# Patient Record
Sex: Male | Born: 1935 | Race: White | Hispanic: No | Marital: Married | Smoking: Never smoker
Health system: Southern US, Community
[De-identification: ages and names within clinical notes are randomized; demographics above are authoritative.]

## PROBLEM LIST (undated history)

## (undated) DIAGNOSIS — E785 Hyperlipidemia, unspecified: Secondary | ICD-10-CM

## (undated) DIAGNOSIS — R972 Elevated prostate specific antigen [PSA]: Secondary | ICD-10-CM

## (undated) DIAGNOSIS — I10 Essential (primary) hypertension: Secondary | ICD-10-CM

## (undated) DIAGNOSIS — N400 Enlarged prostate without lower urinary tract symptoms: Secondary | ICD-10-CM

## (undated) DIAGNOSIS — M1990 Unspecified osteoarthritis, unspecified site: Secondary | ICD-10-CM

## (undated) HISTORY — PX: HAND SURGERY: SHX662

## (undated) HISTORY — DX: Essential (primary) hypertension: I10

## (undated) HISTORY — DX: Elevated prostate specific antigen (PSA): R97.20

## (undated) HISTORY — DX: Unspecified osteoarthritis, unspecified site: M19.90

## (undated) HISTORY — DX: Benign prostatic hyperplasia without lower urinary tract symptoms: N40.0

## (undated) HISTORY — DX: Hyperlipidemia, unspecified: E78.5

---

## 2013-05-25 ENCOUNTER — Other Ambulatory Visit: Payer: Self-pay | Admitting: Neurosurgery

## 2013-05-25 DIAGNOSIS — M5416 Radiculopathy, lumbar region: Secondary | ICD-10-CM

## 2013-05-25 DIAGNOSIS — M4714 Other spondylosis with myelopathy, thoracic region: Secondary | ICD-10-CM

## 2013-05-25 DIAGNOSIS — Z139 Encounter for screening, unspecified: Secondary | ICD-10-CM

## 2013-06-04 ENCOUNTER — Ambulatory Visit
Admission: RE | Admit: 2013-06-04 | Discharge: 2013-06-04 | Disposition: A | Discharge status: Home / Self Care | Payer: Medicare Other | Source: Ambulatory Visit | Attending: Neurosurgery | Admitting: Neurosurgery

## 2013-06-04 DIAGNOSIS — M4714 Other spondylosis with myelopathy, thoracic region: Secondary | ICD-10-CM

## 2013-06-04 DIAGNOSIS — M5416 Radiculopathy, lumbar region: Secondary | ICD-10-CM

## 2013-06-04 DIAGNOSIS — Z139 Encounter for screening, unspecified: Secondary | ICD-10-CM

## 2014-08-29 ENCOUNTER — Ambulatory Visit: Payer: Self-pay | Admitting: Endocrinology

## 2015-08-24 ENCOUNTER — Ambulatory Visit (INDEPENDENT_AMBULATORY_CARE_PROVIDER_SITE_OTHER): Payer: Medicare Other | Admitting: Urology

## 2015-08-24 ENCOUNTER — Encounter: Payer: Self-pay | Admitting: Urology

## 2015-08-24 VITALS — BP 175/73 | HR 51 | Ht 69.0 in | Wt 153.1 lb

## 2015-08-24 DIAGNOSIS — R972 Elevated prostate specific antigen [PSA]: Secondary | ICD-10-CM | POA: Diagnosis not present

## 2015-08-24 DIAGNOSIS — N401 Enlarged prostate with lower urinary tract symptoms: Secondary | ICD-10-CM | POA: Diagnosis not present

## 2015-08-24 DIAGNOSIS — N138 Other obstructive and reflux uropathy: Secondary | ICD-10-CM | POA: Insufficient documentation

## 2015-08-24 NOTE — Progress Notes (Signed)
08/24/2015 3:19 PM   Aaron Grimes July 21, 1930 [id number]  Referring provider: No referring provider defined for this encounter.  Chief Complaint  Patient presents with  . Benign Prostatic Hypertrophy    referred by Dr. Ronnald Collum  . Elevated PSA    HPI: Patient is an 81 year old white male who is referred to [place] by his primary care physician Dr. Ronnald Collum for an elevated PSA of 6.8 ng/mL.  Patient does have a family history of prostate cancer with his father having lethal prostate cancer and passing away at age 53.  He states he has no urinary symptoms at this time.  He does have a history of BPH with LUTS and was given a trial of tamsulosin, but he did not feel any significant improvement while on the medication.  He has since discontinued it.  He states his PSA was around 5 last year.    He has not had dysuria, gross hematuria or suprapubic pain. He has not had any bone pain, loss of appetite or weight loss.  Overall, he states he feels well.  BPH WITH LUTS His IPSS score today is 4, which is mild lower urinary tract symptomatology. He is pleased with his quality life due to his urinary symptoms.  He denies any dysuria, hematuria or suprapubic pain.   He also denies any recent fevers, chills, nausea or vomiting.  He has a family history of PCa, with his father having lethal prostate cancer.         IPSS      08/24/15 1400       International Prostate Symptom Score   How often have you had the sensation of not emptying your bladder? Not at All     How often have you had to urinate less than every two hours? Not at All     How often have you found you stopped and started again several times when you urinated? Less than 1 in 5 times     How often have you found it difficult to postpone urination? Not at All     How often have you had a weak urinary stream? About half the time     How often have you had to strain to start urination? Not at All     How many times did you  typically get up at night to urinate? None     Total IPSS Score 4     Quality of Life due to urinary symptoms   If you were to spend the rest of your life with your urinary condition just the way it is now how would you feel about that? Pleased        Score:  1-7 Mild 8-19 Moderate 20-35 Severe     PMH: Past Medical History  Diagnosis Date  . Arthritis   . BPH (benign prostatic hypertrophy)   . Elevated PSA   . HLD (hyperlipidemia)   . HTN (hypertension)     Surgical History: Past Surgical History  Procedure Laterality Date  . Hand surgery Right     nodule removed    Home Medications:    Medication List       This list is accurate as of: 08/24/15  3:19 PM.  Always use your most recent med list.               aspirin 81 MG tablet  Take 81 mg by mouth daily.     CENTRUM SILVER PO  Take by mouth daily.  HEART SAVIOR PO  Take by mouth daily.        Allergies: No Known Allergies  Family History: Family History  Problem Relation Age of Onset  . Prostate cancer Father     (not sure)  . Kidney disease Neg Hx   . Bone cancer Father     Social History:  reports that he has never smoked. He does not have any smokeless tobacco history on file. He reports that he does not drink alcohol or use illicit drugs.  ROS: UROLOGY Frequent Urination?: No Hard to postpone urination?: No Burning/pain with urination?: No Get up at night to urinate?: No Leakage of urine?: No Urine stream starts and stops?: No Trouble starting stream?: No Do you have to strain to urinate?: No Blood in urine?: No Urinary tract infection?: No Sexually transmitted disease?: No Injury to kidneys or bladder?: No Painful intercourse?: No Weak stream?: No Erection problems?: Yes Penile pain?: No  Gastrointestinal Nausea?: No Vomiting?: No Indigestion/heartburn?: No Diarrhea?: No Constipation?: No  Constitutional Fever: No Night sweats?: No Weight loss?: No Fatigue?:  No  Skin Skin rash/lesions?: No Itching?: Yes  Eyes Blurred vision?: No Double vision?: No  Ears/Nose/Throat Sore throat?: No Sinus problems?: No  Hematologic/Lymphatic Swollen glands?: No Easy bruising?: Yes  Cardiovascular Leg swelling?: Yes Chest pain?: No  Respiratory Cough?: No Shortness of breath?: No  Endocrine Excessive thirst?: No  Musculoskeletal Back pain?: No Joint pain?: No  Neurological Headaches?: No Dizziness?: No  Psychologic Depression?: No Anxiety?: No  Physical Exam: BP 175/73 mmHg  Pulse 51  Ht 5\' 9"  (1.753 m)  Wt 153 lb 1.6 oz (69.446 kg)  BMI 22.60 kg/m2  Constitutional: Well nourished. Alert and oriented, No acute distress. HEENT: [place] AT, moist mucus membranes. Trachea midline, no masses. Cardiovascular: No clubbing, cyanosis, or edema. Respiratory: Normal respiratory effort, no increased work of breathing. GI: Abdomen is soft, non tender, non distended, no abdominal masses. Liver and spleen not palpable.  No hernias appreciated.  Stool sample for occult testing is not indicated.   GU: No CVA tenderness.  No bladder fullness or masses.  Patient with circumcised phallus.   Urethral meatus is patent.  No penile discharge. No penile lesions or rashes. Scrotum without lesions, cysts, rashes and/or edema.  Testicles are located scrotally bilaterally.  They are atrophic.  (he had mumps as a child)  No masses are appreciated in the testicles. Left and right epididymis are normal. Rectal: Patient with  normal sphincter tone. Anus and perineum without scarring or rashes. No rectal masses are appreciated. Prostate is approximately 45 grams, a firm area is appreciated in the right lobe.  Seminal vesicles are normal. Skin: No rashes, bruises or suspicious lesions. Lymph: No cervical or inguinal adenopathy. Neurologic: Grossly intact, no focal deficits, moving all 4 extremities. Psychiatric: Normal mood and affect.  Laboratory Data: PSA  History  6.8 ng/mL on 08/22/2015   Assessment & Plan:    1. BPH (benign prostatic hyperplasia) with LUTS:   IPSS score 4/1.  Patient was given a trial with tamsulosin with Dr. Ronnald Collum, but he didn't feel that it made any difference.   He will be returning in 6 months for an IPSS score, exam and PSA.  - PSA  2. Elevated PSA:   Patient's PSA has risen to 6.8 ng/mL.  I have had it redrawn again today to rule out laboratory error.  I discussed with the patient the AUA guidelines for guarding men over 70 and PSA screening.  I explained that they recommended to have a discussion concerning PSA screening and then if a PSA screening is conducted the threshold value for the biopsy would be 10.0 ng/mL.  He would like to postpone a biopsy untill the PSA reaches this level.   I also explained to him that I did find an area of firmness within his prostate gland.   I discussed with the patient that without a biopsy,  I cannot be certain if this is a highly aggressive prostate cancer that may spread to his bones in a short period of time.   He understands this risk and would like to return in 6 months for a PSA and exam.     Return in about 6 months (around 02/22/2016) for exam and IPSS.  Zara Council, [facility] [address] [place], [place] [address] [phone]

## 2015-08-25 LAB — PSA: PROSTATE SPECIFIC AG, SERUM: 6.4 ng/mL — AB (ref 0.0–4.0)

## 2015-09-06 ENCOUNTER — Telehealth: Payer: Self-pay

## 2015-09-06 NOTE — Telephone Encounter (Signed)
Spoke with pt in reference to PSA results. Pt voiced understanding.  

## 2015-09-06 NOTE — Telephone Encounter (Signed)
-----   Message from Nori Riis, PA-C sent at 09/05/2015  5:00 PM EST ----- Patient's PSA is relatively unchanged from the value at his PCP's office.  He has chosen to wait 6 months for a repeat PSA and exam.  We will see him in June.

## 2015-12-04 ENCOUNTER — Encounter: Payer: Self-pay | Admitting: Podiatry

## 2015-12-04 ENCOUNTER — Ambulatory Visit (INDEPENDENT_AMBULATORY_CARE_PROVIDER_SITE_OTHER): Payer: Medicare Other | Admitting: Podiatry

## 2015-12-04 VITALS — BP 190/86 | HR 54 | Resp 16

## 2015-12-04 DIAGNOSIS — M79676 Pain in unspecified toe(s): Secondary | ICD-10-CM | POA: Diagnosis not present

## 2015-12-04 DIAGNOSIS — Q828 Other specified congenital malformations of skin: Secondary | ICD-10-CM | POA: Diagnosis not present

## 2015-12-04 DIAGNOSIS — B351 Tinea unguium: Secondary | ICD-10-CM | POA: Diagnosis not present

## 2015-12-04 NOTE — Progress Notes (Signed)
   Subjective:    Patient ID: Aaron Grimes, male    DOB: [birth year], 82 y.o.   MRN: [id number]  HPI: He presents today with his wife complaining of a painful corn to the lateral aspect fourth digit of the right foot. He states that he is a neurological problem from his back always down his right side. 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 He states that he noticed the corn between the fourth and fifth toes when his foot started to ache. He also complains of painfully elongated toenails bilateral. He also complains of a painful first metatarsal phalangeal joint right foot.    Review of Systems  HENT: Positive for hearing loss.   Eyes: Positive for visual disturbance.  Musculoskeletal: Positive for arthralgias.  Neurological: Positive for weakness and numbness.  All other systems reviewed and are negative.      Objective:   Physical Exam: I have reviewed his past medical history medications allergy surgery social history and review of systems. Pulses are strongly palpable. Neurologic sensorium is diminished her Semmes-Weinstein monofilament right foot. left foot appears to be normal. Muscle strength is normal bilateral. Orthopedic evaluation demonstrates osteoarthritic changes first metatarsal medial cuneiform joint as well as first metatarsal phalangeal joint. Limited range of motion in both these areas and hypertrophic bone growth is noted. Otherwise all joints distal to the ankle while full range of motion without crepitation. Mild flexible hammertoe deformities are noted. Cutaneous evaluation of a straight supple well-hydrated cutis no erythema edema cellulitis drainage or odor. Reactive hyperkeratotic lesion lateral aspect of the fourth toe right foot and medial aspect of the fifth toe right foot. No open lesions or wounds noted. His toenails are thick yellow dystrophic clinic for mycotic and  painful on palpation as well as debridement.        Assessment & Plan:  Assessment: Pain in limb secondary to onychomycosis and porokeratosis.  Plan: Debridement of all reactive hyperkeratotic tissue and toenails bilaterally. Follow up with him in 3 months.

## 2016-02-15 ENCOUNTER — Other Ambulatory Visit: Payer: Self-pay

## 2016-02-15 DIAGNOSIS — R972 Elevated prostate specific antigen [PSA]: Secondary | ICD-10-CM

## 2016-02-16 ENCOUNTER — Other Ambulatory Visit: Payer: Medicare Other

## 2016-02-16 DIAGNOSIS — R972 Elevated prostate specific antigen [PSA]: Secondary | ICD-10-CM

## 2016-02-17 LAB — PSA: PROSTATE SPECIFIC AG, SERUM: 8.2 ng/mL — AB (ref 0.0–4.0)

## 2016-02-23 ENCOUNTER — Encounter: Payer: Self-pay | Admitting: Urology

## 2016-02-23 ENCOUNTER — Ambulatory Visit (INDEPENDENT_AMBULATORY_CARE_PROVIDER_SITE_OTHER): Payer: Medicare Other | Admitting: Urology

## 2016-02-23 VITALS — BP 164/76 | HR 65 | Ht 69.0 in | Wt 156.9 lb

## 2016-02-23 DIAGNOSIS — N401 Enlarged prostate with lower urinary tract symptoms: Secondary | ICD-10-CM | POA: Diagnosis not present

## 2016-02-23 DIAGNOSIS — N138 Other obstructive and reflux uropathy: Secondary | ICD-10-CM

## 2016-02-23 DIAGNOSIS — R972 Elevated prostate specific antigen [PSA]: Secondary | ICD-10-CM

## 2016-02-23 MED ORDER — FINASTERIDE 5 MG PO TABS: 5.0000 mg | ORAL_TABLET | Freq: Every day | ORAL | Status: DC

## 2016-02-23 NOTE — Progress Notes (Signed)
8:47 AM   Aaron Grimes [birth year] [id number]  Referring provider: Lenard Simmer, MD [address] [place], [place] [address]  Chief Complaint  Patient presents with  . Benign Prostatic Hypertrophy    6 month follow up  . Elevated PSA    HPI: Patient is an 82 year old Caucasian male with a history of elevated PSA and BPH with LUTS who presents today for 6 month follow-up.  History of elevated PSA Patient was referred to [place] by his primary care physician Dr. Ronnald Collum for an elevated PSA of 6.8 ng/mL.  Patient does have a family history of prostate cancer with his father having lethal prostate cancer and passing away at age 4.  His most recent PSA was 8.2 ng/mL on 02/16/2016.    BPH WITH LUTS His IPSS score today is 6, which is mild lower urinary tract symptomatology. He is mostly satisfied with his quality life due to his urinary symptoms.  He denies any dysuria, hematuria or suprapubic pain.   He also denies any recent fevers, chills, nausea or vomiting.  He has a family history of PCa, with his father having lethal prostate cancer.        IPSS      02/23/16 0800       International Prostate Symptom Score   How often have you had the sensation of not emptying your bladder? Not at All     How often have you had to urinate less than every two hours? Not at All     How often have you found you stopped and started again several times when you urinated? Less than 1 in 5 times     How often have you found it difficult to postpone urination? Not at All     How often have you had a weak urinary stream? Almost always     How often have you had to strain to start urination? Not at All     How many times did you typically get up at night to urinate? None     Total IPSS Score 6     Quality of Life due to urinary symptoms   If you were to spend the rest of your life with your urinary condition just the way it is now how would you feel about that? Mostly Satisfied         Score:  1-7 Mild 8-19 Moderate 20-35 Severe     PMH: Past Medical History  Diagnosis Date  . Arthritis   . BPH (benign prostatic hypertrophy)   . Elevated PSA   . HLD (hyperlipidemia)   . HTN (hypertension)     Surgical History: Past Surgical History  Procedure Laterality Date  . Hand surgery Right     nodule removed    Home Medications:    Medication List       This list is accurate as of: 02/23/16  8:47 AM.  Always use your most recent med list.               aspirin 81 MG tablet  Take 81 mg by mouth daily.     CENTRUM SILVER PO  Take by mouth daily.     finasteride 5 MG tablet  Commonly known as:  PROSCAR  Take 1 tablet (5 mg total) by mouth daily.     FISH OIL PO  Take by mouth.     HEART SAVIOR PO  Take by mouth daily.  Allergies: No Known Allergies  Family History: Family History  Problem Relation Age of Onset  . Prostate cancer Father     (not sure)  . Kidney disease Neg Hx   . Bone cancer Father     Social History:  reports that he has never smoked. He does not have any smokeless tobacco history on file. He reports that he does not drink alcohol or use illicit drugs.  ROS: UROLOGY Frequent Urination?: No Hard to postpone urination?: No Burning/pain with urination?: No Get up at night to urinate?: No Leakage of urine?: No Urine stream starts and stops?: No Trouble starting stream?: No Do you have to strain to urinate?: No Blood in urine?: No Urinary tract infection?: No Sexually transmitted disease?: No Injury to kidneys or bladder?: No Painful intercourse?: No Weak stream?: Yes Erection problems?: Yes Penile pain?: No  Gastrointestinal Nausea?: No Vomiting?: No Indigestion/heartburn?: No Diarrhea?: No Constipation?: No  Constitutional Fever: No Night sweats?: No Weight loss?: No Fatigue?: No  Skin Skin rash/lesions?: No Itching?: Yes  Eyes Blurred vision?: No Double vision?:  No  Ears/Nose/Throat Sore throat?: No Sinus problems?: No  Hematologic/Lymphatic Swollen glands?: No Easy bruising?: Yes  Cardiovascular Leg swelling?: No Chest pain?: No  Respiratory Cough?: No Shortness of breath?: No  Endocrine Excessive thirst?: No  Musculoskeletal Back pain?: No Joint pain?: No  Neurological Headaches?: No Dizziness?: No  Psychologic Depression?: No Anxiety?: No  Physical Exam: BP 164/76 mmHg  Pulse 65  Ht 5\' 9"  (1.753 m)  Wt 156 lb 14.4 oz (71.169 kg)  BMI 23.16 kg/m2  Constitutional: Well nourished. Alert and oriented, No acute distress. HEENT: [place] AT, moist mucus membranes. Trachea midline, no masses. Cardiovascular: No clubbing, cyanosis, or edema. Respiratory: Normal respiratory effort, no increased work of breathing. GI: Abdomen is soft, non tender, non distended, no abdominal masses. Liver and spleen not palpable.  No hernias appreciated.  Stool sample for occult testing is not indicated.   GU: No CVA tenderness.  No bladder fullness or masses.  Patient with circumcised phallus.   Urethral meatus is patent.  No penile discharge. No penile lesions or rashes. Scrotum without lesions, cysts, rashes and/or edema.  Testicles are located scrotally bilaterally.  They are atrophic.  (he had mumps as a child)  No masses are appreciated in the testicles. Left and right epididymis are normal. Rectal: Patient with  normal sphincter tone. Anus and perineum without scarring or rashes. No rectal masses are appreciated. Prostate is approximately 45 grams, a firm area is appreciated in the right lobe.  Seminal vesicles are normal. Skin: No rashes, bruises or suspicious lesions. Lymph: No cervical or inguinal adenopathy. Neurologic: Grossly intact, no focal deficits, moving all 4 extremities. Psychiatric: Normal mood and affect.  Laboratory Data: PSA History  6.8 ng/mL on 08/22/2015  6.4 ng/mL on 08/24/2015  8.2 ng/mL on 02/16/2016   Assessment &  Plan:    1. BPH (benign prostatic hyperplasia) with LUTS:   IPSS score 6/2.   We will continue to monitor.  His PSA has risen slightly over the last 6 months.  He will start finasteride 5 mg daily and RTC in 3 months for a PSA.    2. Elevated PSA:   Patient's PSA has risen to 8.2 ng/mL.   I discussed with the patient the AUA guidelines for guarding men over 70 and PSA screening.   I explained that they recommended to have a discussion concerning PSA screening and then if a PSA screening is  conducted the threshold value for the biopsy would be 10.0 ng/mL.  He would like to postpone a biopsy until the PSA reaches this level.   I also explained to him that I did find an area of firmness within his prostate gland.   I discussed with the patient that without a biopsy,  I cannot be certain if this is a highly aggressive prostate cancer that may spread to his bones in a short period of time.   He will start finasteride at this time.  He understands this risk and would like to return in 3 months for a PSA.  If PSA continues to rise with the finasteride on board, we will need to have further discussions considering his next course of action.   Return in about 3 months (around 05/25/2016) for PSA only.  Zara Council, [facility] [address], [place], [place] [address] [phone]

## 2016-03-11 ENCOUNTER — Ambulatory Visit (INDEPENDENT_AMBULATORY_CARE_PROVIDER_SITE_OTHER): Payer: Medicare Other | Admitting: Podiatry

## 2016-03-11 ENCOUNTER — Encounter: Payer: Self-pay | Admitting: Podiatry

## 2016-03-11 DIAGNOSIS — M79676 Pain in unspecified toe(s): Secondary | ICD-10-CM

## 2016-03-11 DIAGNOSIS — Q828 Other specified congenital malformations of skin: Secondary | ICD-10-CM | POA: Diagnosis not present

## 2016-03-11 DIAGNOSIS — B351 Tinea unguium: Secondary | ICD-10-CM | POA: Diagnosis not present

## 2016-03-11 NOTE — Progress Notes (Signed)
He presents today with chief complaint of painful elongated toenails.  Objective: Vital signs are stable alert and oriented 3. Toenails are thick yellow dystrophic with mycotic painful palpation sharply incurvated nail margins.  Assessment: nails paronychia abscess hallux bilateral. Probable onychomycosis.  Plan: Debridement of toenails 1 through 5 bilateral.

## 2016-05-23 ENCOUNTER — Other Ambulatory Visit: Payer: Self-pay

## 2016-05-23 DIAGNOSIS — R972 Elevated prostate specific antigen [PSA]: Secondary | ICD-10-CM

## 2016-05-24 ENCOUNTER — Other Ambulatory Visit: Payer: Medicare Other

## 2016-05-24 DIAGNOSIS — R972 Elevated prostate specific antigen [PSA]: Secondary | ICD-10-CM

## 2016-05-25 LAB — PSA: PROSTATE SPECIFIC AG, SERUM: 4.6 ng/mL — AB (ref 0.0–4.0)

## 2016-05-28 ENCOUNTER — Encounter: Payer: Self-pay | Admitting: Urology

## 2016-05-28 ENCOUNTER — Telehealth: Payer: Self-pay

## 2016-05-28 NOTE — Telephone Encounter (Signed)
-----   Message from Nori Riis, PA-C sent at 05/28/2016  1:04 PM EDT ----- Please notify the patient that his PSA has reduced on the finasteride.  He should continue that medication and follow up in 6 months for an office visit.

## 2016-05-28 NOTE — Telephone Encounter (Signed)
Line busy

## 2016-05-29 ENCOUNTER — Other Ambulatory Visit: Payer: Self-pay

## 2016-05-29 DIAGNOSIS — N401 Enlarged prostate with lower urinary tract symptoms: Principal | ICD-10-CM

## 2016-05-29 DIAGNOSIS — N400 Enlarged prostate without lower urinary tract symptoms: Secondary | ICD-10-CM

## 2016-05-29 DIAGNOSIS — N138 Other obstructive and reflux uropathy: Secondary | ICD-10-CM

## 2016-05-29 MED ORDER — FINASTERIDE 5 MG PO TABS: 5.0000 mg | ORAL_TABLET | Freq: Every day | ORAL | 3 refills | Status: DC

## 2016-05-29 MED ORDER — DUTASTERIDE 0.5 MG PO CAPS: 0.5000 mg | ORAL_CAPSULE | Freq: Every day | ORAL | 3 refills | Status: DC

## 2016-05-29 NOTE — Telephone Encounter (Signed)
Made pt aware via mychart.  

## 2016-06-10 ENCOUNTER — Encounter: Payer: Self-pay | Admitting: Podiatry

## 2016-06-10 ENCOUNTER — Ambulatory Visit (INDEPENDENT_AMBULATORY_CARE_PROVIDER_SITE_OTHER): Payer: Medicare Other | Admitting: Podiatry

## 2016-06-10 DIAGNOSIS — M79676 Pain in unspecified toe(s): Secondary | ICD-10-CM | POA: Diagnosis not present

## 2016-06-10 DIAGNOSIS — Q828 Other specified congenital malformations of skin: Secondary | ICD-10-CM

## 2016-06-10 DIAGNOSIS — B351 Tinea unguium: Secondary | ICD-10-CM | POA: Diagnosis not present

## 2016-06-10 NOTE — Progress Notes (Signed)
He presents today with chief complaint of painful elongated toenails.  Objective: Toenails are thick yellow dystrophic with mycotic painful elongation.  Assessment: Pain limited secondary onychomycosis.  Plan: Debridement of toenails 1 through 5 bilateral.

## 2016-08-23 ENCOUNTER — Encounter: Payer: Self-pay | Admitting: Urology

## 2016-09-02 ENCOUNTER — Telehealth: Payer: Self-pay

## 2016-09-02 NOTE — Telephone Encounter (Signed)
Spoke with pt in reference to calling PCP about finasteride. Pt voiced understanding.

## 2016-09-02 NOTE — Telephone Encounter (Signed)
Patient should contact his PCP as dizziness is not a typical side effect of finasteride.  In fact, I would expect the dutasteride to make him dizzy as it is a common side effect of that medication.

## 2016-09-02 NOTE — Telephone Encounter (Signed)
Pt called stating he is currently taking finasteride. Pt c/o dizziness that lasted all day over the weekend. Pt stated that while he was on the dustasteride he did not experience any of these symptoms. Please advise.

## 2016-09-12 ENCOUNTER — Ambulatory Visit: Payer: Medicare Other | Admitting: Podiatry

## 2016-09-13 ENCOUNTER — Ambulatory Visit: Payer: Medicare Other | Admitting: Podiatry

## 2017-02-26 ENCOUNTER — Other Ambulatory Visit: Payer: Self-pay | Admitting: Urology

## 2017-02-26 DIAGNOSIS — N401 Enlarged prostate with lower urinary tract symptoms: Principal | ICD-10-CM

## 2017-02-26 DIAGNOSIS — N138 Other obstructive and reflux uropathy: Secondary | ICD-10-CM

## 2017-02-27 ENCOUNTER — Other Ambulatory Visit: Payer: Self-pay | Admitting: Urology

## 2017-02-27 DIAGNOSIS — N138 Other obstructive and reflux uropathy: Secondary | ICD-10-CM

## 2017-02-27 DIAGNOSIS — N401 Enlarged prostate with lower urinary tract symptoms: Principal | ICD-10-CM

## 2017-03-16 NOTE — Progress Notes (Signed)
9:30 AM   Aaron Grimes 1930-05-01 [id number]  Referring provider: Lenard Simmer, MD [address] [place], [place] [address]  Chief Complaint  Patient presents with  . Medication Refill    last seen 6/17 for BPH    HPI: Patient is an 83 year old Caucasian male with a history of elevated PSA and BPH with LUTS who presents today for 12 month follow-up.  History of elevated PSA Patient was referred to [place] by his primary care physician Dr. Ronnald Collum for an elevated PSA of 6.8 ng/mL.  Patient does have a family history of prostate cancer with his father having lethal prostate cancer and passing away at age 81.  His most recent PSA was 4.6 ng/mL on 09/01//2017.    BPH WITH LUTS His IPSS score today is 1, which is mild lower urinary tract symptomatology.  He is pleased with his quality life due to his urinary symptoms.  His previous I PSS score 6/2.  His major complaints today are a weak stream.  He is currently taking finasteride.  He denies any dysuria, hematuria or suprapubic pain.   He also denies any recent fevers, chills, nausea or vomiting.  He has a family history of PCa, with his father having lethal prostate cancer.        IPSS    Row Name 03/17/17 0900         International Prostate Symptom Score   How often have you had the sensation of not emptying your bladder? Not at All     How often have you had to urinate less than every two hours? Not at All     How often have you found you stopped and started again several times when you urinated? Not at All     How often have you found it difficult to postpone urination? Not at All     How often have you had a weak urinary stream? Less than 1 in 5 times     How often have you had to strain to start urination? Not at All     How many times did you typically get up at night to urinate? None     Total IPSS Score 1       Quality of Life due to urinary symptoms   If you were to spend the rest of your life with your  urinary condition just the way it is now how would you feel about that? Pleased        Score:  1-7 Mild 8-19 Moderate 20-35 Severe  Family history of prostate cancer He has a family history of PCa, with his father having lethal prostate cancer.      PMH: Past Medical History:  Diagnosis Date  . Arthritis   . BPH (benign prostatic hypertrophy)   . Elevated PSA   . HLD (hyperlipidemia)   . HTN (hypertension)     Surgical History: Past Surgical History:  Procedure Laterality Date  . HAND SURGERY Right    nodule removed    Home Medications:  Allergies as of 03/17/2017   No Known Allergies     Medication List       Accurate as of 03/17/17  9:30 AM. Always use your most recent med list.          aspirin 325 MG tablet Take 325 mg by mouth daily.   aspirin 81 MG tablet Take 81 mg by mouth daily.   CENTRUM SILVER PO Take by mouth daily.  clopidogrel 75 MG tablet Commonly known as:  PLAVIX Take 75 mg by mouth daily.   dutasteride 0.5 MG capsule Commonly known as:  AVODART Take 1 capsule (0.5 mg total) by mouth daily.   finasteride 5 MG tablet Commonly known as:  PROSCAR Take 1 tablet (5 mg total) by mouth daily.   FISH OIL PO Take by mouth.   HEART SAVIOR PO Take by mouth daily.   lisinopril 5 MG tablet Commonly known as:  PRINIVIL,ZESTRIL Take 5 mg by mouth daily.       Allergies: No Known Allergies  Family History: Family History  Problem Relation Age of Onset  . Prostate cancer Father        (not sure)  . Bone cancer Father   . Kidney disease Neg Hx   . Kidney cancer Neg Hx   . Bladder Cancer Neg Hx     Social History:  reports that he has never smoked. He has never used smokeless tobacco. He reports that he does not drink alcohol or use drugs.  ROS: UROLOGY Frequent Urination?: No Hard to postpone urination?: No Burning/pain with urination?: No Get up at night to urinate?: No Leakage of urine?: No Urine stream starts and  stops?: No Trouble starting stream?: No Do you have to strain to urinate?: No Blood in urine?: No Urinary tract infection?: No Sexually transmitted disease?: No Injury to kidneys or bladder?: No Painful intercourse?: No Weak stream?: No Erection problems?: No Penile pain?: No  Gastrointestinal Nausea?: No Vomiting?: No Indigestion/heartburn?: No Diarrhea?: No Constipation?: No  Constitutional Fever: No Night sweats?: No Weight loss?: No Fatigue?: No  Skin Skin rash/lesions?: No Itching?: No  Eyes Blurred vision?: No Double vision?: No  Ears/Nose/Throat Sore throat?: No Sinus problems?: No  Hematologic/Lymphatic Swollen glands?: No Easy bruising?: No  Cardiovascular Leg swelling?: No Chest pain?: No  Respiratory Cough?: No Shortness of breath?: No  Endocrine Excessive thirst?: No  Musculoskeletal Back pain?: No Joint pain?: No  Neurological Headaches?: No Dizziness?: No  Psychologic Depression?: No Anxiety?: No  Physical Exam: BP (!) 142/67   Pulse 64   Ht 5\' 9"  (1.753 m)   Wt 155 lb 3.2 oz (70.4 kg)   BMI 22.92 kg/m   Constitutional: Well nourished. Alert and oriented, No acute distress. HEENT: [place] AT, moist mucus membranes. Trachea midline, no masses. Cardiovascular: No clubbing, cyanosis, or edema. Respiratory: Normal respiratory effort, no increased work of breathing. GI: Abdomen is soft, non tender, non distended, no abdominal masses. Liver and spleen not palpable.  No hernias appreciated.  Stool sample for occult testing is not indicated.   GU: No CVA tenderness.  No bladder fullness or masses.  Patient with circumcised phallus.   Urethral meatus is patent.  No penile discharge. No penile lesions or rashes. Scrotum without lesions, cysts, rashes and/or edema.  Testicles are located scrotally bilaterally.  They are atrophic.  (he had mumps as a child)  No masses are appreciated in the testicles. Left and right epididymis are  normal. Rectal: Patient with  normal sphincter tone. Anus and perineum without scarring or rashes. No rectal masses are appreciated. Prostate is approximately 45 grams, a firm area is appreciated in the right lobe.  Seminal vesicles are normal. Skin: No rashes, bruises or suspicious lesions. Lymph: No cervical or inguinal adenopathy. Neurologic: Grossly intact, no focal deficits, moving all 4 extremities. Psychiatric: Normal mood and affect.  Laboratory Data: PSA History  6.8 ng/mL on 08/22/2015  6.4 ng/mL on 08/24/2015  8.2 ng/mL on 02/16/2016  4.6 ng/mL on 05/24/2016   Assessment & Plan:    1. BPH (benign prostatic hyperplasia) with LUTS:   IPSS score 6/2.   We will continue to monitor.  He will continue finasteride 5 mg daily and RTC in 12 months for I PSS and exam if PSA remains stable   2. Elevated PSA  - most recent PSA was 4.6 in 05/2016  - continue finasteride 5 mg daily; refills given  - PSA drawn today  3. Family history of prostate cancer  - father with lethal prostate cancer  - PSA drawn today  Return in about 1 year (around 03/17/2018) for IPSS, PSA and exam.  Zara Council, [facility]  [facility] [address], [place], [place] [address] [phone]

## 2017-03-17 ENCOUNTER — Encounter: Payer: Self-pay | Admitting: Urology

## 2017-03-17 ENCOUNTER — Ambulatory Visit (INDEPENDENT_AMBULATORY_CARE_PROVIDER_SITE_OTHER): Payer: Medicare Other | Admitting: Urology

## 2017-03-17 VITALS — BP 142/67 | HR 64 | Ht 69.0 in | Wt 155.2 lb

## 2017-03-17 DIAGNOSIS — N401 Enlarged prostate with lower urinary tract symptoms: Secondary | ICD-10-CM

## 2017-03-17 DIAGNOSIS — Z8042 Family history of malignant neoplasm of prostate: Secondary | ICD-10-CM | POA: Diagnosis not present

## 2017-03-17 DIAGNOSIS — N138 Other obstructive and reflux uropathy: Secondary | ICD-10-CM

## 2017-03-17 DIAGNOSIS — R972 Elevated prostate specific antigen [PSA]: Secondary | ICD-10-CM

## 2017-03-18 ENCOUNTER — Telehealth: Payer: Self-pay

## 2017-03-18 LAB — PSA: Prostate Specific Ag, Serum: 7.1 ng/mL — ABNORMAL HIGH (ref 0.0–4.0)

## 2017-03-18 NOTE — Telephone Encounter (Signed)
Spoke w/Tatiana at [facility] and test was added on will fax form to fill out for adding on test if specimen has not been discarded

## 2017-03-18 NOTE — Telephone Encounter (Signed)
-----   Message from Nori Riis, PA-C sent at 03/18/2017  7:51 AM EDT ----- Would you please add a free and total PSA to his blood work?

## 2017-03-19 ENCOUNTER — Other Ambulatory Visit: Payer: Self-pay | Admitting: Urology

## 2017-03-19 DIAGNOSIS — N401 Enlarged prostate with lower urinary tract symptoms: Principal | ICD-10-CM

## 2017-03-19 DIAGNOSIS — N138 Other obstructive and reflux uropathy: Secondary | ICD-10-CM

## 2017-03-25 ENCOUNTER — Encounter: Payer: Self-pay | Admitting: Urology

## 2017-03-28 ENCOUNTER — Telehealth: Payer: Self-pay | Admitting: Family Medicine

## 2017-03-28 NOTE — Telephone Encounter (Signed)
-----   Message from Nori Riis, PA-C sent at 03/26/2017 10:09 AM EDT ----- Please let Aaron Grimes know that his PSA is elevated, but it seems to trend up and down.  His corrected value for his PSA at this time is 14.4 and his probability of having prostate cancer at this time is 35%.   Due to his age, I would recommend to continue to monitor the PSA through [place] and his PCP.  We will see him in 6 months.

## 2017-03-28 NOTE — Telephone Encounter (Signed)
Left message for patient to return call.

## 2017-03-28 NOTE — Telephone Encounter (Signed)
Patient returned call.  I provided him with the message below regarding his PSA.  Patient expressed understanding.  6 month lab appointment scheduled for 09/29/17 at 8:30am and office visit with [facility] scheduled for 10/06/17 at 8:30am

## 2017-04-10 LAB — PSA, TOTAL AND FREE
PROSTATE SPECIFIC AG, SERUM: 7.2 ng/mL — AB (ref 0.0–4.0)
PSA FREE PCT: 13.9 %
PSA FREE: 1 ng/mL

## 2017-04-10 LAB — SPECIMEN STATUS REPORT

## 2017-09-10 ENCOUNTER — Other Ambulatory Visit: Payer: Self-pay | Admitting: Urology

## 2017-09-10 DIAGNOSIS — N401 Enlarged prostate with lower urinary tract symptoms: Principal | ICD-10-CM

## 2017-09-10 DIAGNOSIS — N138 Other obstructive and reflux uropathy: Secondary | ICD-10-CM

## 2017-09-29 ENCOUNTER — Other Ambulatory Visit: Payer: Self-pay

## 2017-09-29 ENCOUNTER — Other Ambulatory Visit: Payer: Medicare Other

## 2017-09-29 DIAGNOSIS — N401 Enlarged prostate with lower urinary tract symptoms: Secondary | ICD-10-CM

## 2017-09-30 LAB — PSA: Prostate Specific Ag, Serum: 6.4 ng/mL — ABNORMAL HIGH (ref 0.0–4.0)

## 2017-10-05 NOTE — Progress Notes (Signed)
8:55 AM   Aaron Grimes [birth year] [id number]  Referring provider: Lenard Simmer, MD [address] [place], [place] [address]  Chief Complaint  Patient presents with  . Benign Prostatic Hypertrophy  . Follow-up    HPI: Patient is an 84 year old Caucasian male with an elevated PSA and BPH with LUTS who presents today for 12 month follow-up.  Elevated PSA Patient was referred to [place] by his primary care physician Dr. Ronnald Collum for an elevated PSA of 6.8 ng/mL.  Patient does have a family history of prostate cancer with his father having lethal prostate cancer and passing away at age 81.  His most recent PSA was 6.4 ng/mL on 09/29/2017.  He states his PCP, Dr. Ronnald Collum obtained a PSA ~ 3 months and it was around 6.    Trend below:    Component     Latest Ref Rng & Units 08/24/2015 02/16/2016 05/24/2016 03/17/2017            9:11 AM  Prostate Specific Ag, Serum     0.0 - 4.0 ng/mL 6.4 (H) 8.2 (H) 4.6 (H) 7.1 (H)   Component     Latest Ref Rng & Units 03/17/2017 09/29/2017         9:11 AM   Prostate Specific Ag, Serum     0.0 - 4.0 ng/mL 7.2 (H) 6.4 (H)      BPH WITH LUTS His IPSS score today is 6, which is mild lower urinary tract symptomatology.  He is pleased with his quality life due to his urinary symptoms.  His previous I PSS score 1/1.  His major complaint(s) today is/are intermittency and a weak urinary stream.  He is currently taking finasteride 5 mg daily.  He denies any dysuria, hematuria or suprapubic pain.   He also denies any recent fevers, chills, nausea or vomiting.  He has a family history of PCa, with his father having lethal prostate cancer.    IPSS    Row Name 10/06/17 0800         International Prostate Symptom Score   How often have you had the sensation of not emptying your bladder?  Not at All     How often have you had to urinate less than every two hours?  Not at All     How often have you found you stopped and started again several times  when you urinated?  About half the time     How often have you found it difficult to postpone urination?  Not at All     How often have you had a weak urinary stream?  About half the time     How often have you had to strain to start urination?  Not at All     How many times did you typically get up at night to urinate?  None     Total IPSS Score  6       Quality of Life due to urinary symptoms   If you were to spend the rest of your life with your urinary condition just the way it is now how would you feel about that?  Pleased        Score:  1-7 Mild 8-19 Moderate 20-35 Severe  Family history of prostate cancer He has a family history of PCa, with his father having lethal prostate cancer.      PMH: Past Medical History:  Diagnosis Date  . Arthritis   . BPH (benign prostatic hypertrophy)   .  Elevated PSA   . HLD (hyperlipidemia)   . HTN (hypertension)     Surgical History: Past Surgical History:  Procedure Laterality Date  . HAND SURGERY Right    nodule removed    Home Medications:  Allergies as of 10/06/2017   No Known Allergies     Medication List        Accurate as of 10/06/17  8:55 AM. Always use your most recent med list.          aspirin 325 MG tablet Take 325 mg by mouth daily.   clopidogrel 75 MG tablet Commonly known as:  PLAVIX Take 75 mg by mouth daily.   finasteride 5 MG tablet Commonly known as:  PROSCAR Take 1 tablet (5 mg total) by mouth daily.   FISH OIL PO Take by mouth.   lisinopril 5 MG tablet Commonly known as:  PRINIVIL,ZESTRIL Take 5 mg by mouth daily.   Vitamin D (Ergocalciferol) 50000 units Caps capsule Commonly known as:  DRISDOL Take 50,000 Units by mouth every 7 (seven) days.       Allergies: No Known Allergies  Family History: Family History  Problem Relation Age of Onset  . Prostate cancer Father        (not sure)  . Bone cancer Father   . Kidney disease Neg Hx   . Kidney cancer Neg Hx   . Bladder Cancer  Neg Hx     Social History:  reports that  has never smoked. he has never used smokeless tobacco. He reports that he does not drink alcohol or use drugs.  ROS: UROLOGY Frequent Urination?: No Hard to postpone urination?: No Burning/pain with urination?: No Get up at night to urinate?: No Leakage of urine?: No Urine stream starts and stops?: No Trouble starting stream?: No Do you have to strain to urinate?: No Blood in urine?: No Urinary tract infection?: No Sexually transmitted disease?: No Injury to kidneys or bladder?: No Painful intercourse?: No Weak stream?: No Erection problems?: Yes Penile pain?: No  Gastrointestinal Nausea?: No Vomiting?: No Indigestion/heartburn?: No Diarrhea?: No Constipation?: No  Constitutional Fever: No Night sweats?: No Weight loss?: No Fatigue?: No  Skin Skin rash/lesions?: No Itching?: No  Eyes Blurred vision?: No Double vision?: No  Ears/Nose/Throat Sore throat?: No Sinus problems?: No  Hematologic/Lymphatic Swollen glands?: No Easy bruising?: No  Cardiovascular Leg swelling?: No Chest pain?: No  Respiratory Cough?: Yes Shortness of breath?: Yes  Endocrine Excessive thirst?: No  Musculoskeletal Back pain?: No Joint pain?: Yes  Neurological Headaches?: No Dizziness?: No  Psychologic Depression?: No Anxiety?: No  Physical Exam: BP 135/71   Pulse 64   Ht 5\' 8"  (1.727 m)   Wt 152 lb (68.9 kg)   BMI 23.11 kg/m   Constitutional: Well nourished. Alert and oriented, No acute distress. HEENT: [place] AT, moist mucus membranes. Trachea midline, no masses. Cardiovascular: No clubbing, cyanosis, or edema. Respiratory: Normal respiratory effort, no increased work of breathing. GI: Abdomen is soft, non tender, non distended, no abdominal masses. Liver and spleen not palpable.  No hernias appreciated.  Stool sample for occult testing is not indicated.   GU: No CVA tenderness.  No bladder fullness or masses.  Patient  with circumcised phallus.  Urethral meatus is patent.  No penile discharge. No penile lesions or rashes. Scrotum without lesions, cysts, rashes and/or edema.  Testicles are located scrotally bilaterally. No masses are appreciated in the testicles. Left and right epididymis are normal. Rectal: Patient with  normal sphincter  tone. Anus and perineum without scarring or rashes. No rectal masses are appreciated. Prostate is approximately 45 grams, a firm area is still appreciated in the right lobe.  Seminal vesicles are normal. Skin: No rashes, bruises or suspicious lesions. Lymph: No cervical or inguinal adenopathy. Neurologic: Grossly intact, no focal deficits, moving all 4 extremities. Psychiatric: Normal mood and affect.   Laboratory Data: PSA History  6.8 ng/mL on 08/22/2015  6.4 ng/mL on 08/24/2015  8.2 ng/mL on 02/16/2016  Started on finasteride in 02/2016  4.6 ng/mL on 05/24/2016  Patient discontinued the finasteride   7.2 ng/mL in 02/2017  Patient restarted the finasteride in 02/2017  6.4 ng/mL in 09/2017  I have reviewed the labs.  Assessment & Plan:    1. BPH (benign prostatic hyperplasia) with LUTS:   IPSS score 6/1, it is mildly improved.  We will continue to monitor.  He will continue finasteride 5 mg daily and RTC in 6 months for I PSS and exam if PSA remains stable   2. Elevated PSA  - most recent PSA was 6.4 in 09/2017  - discussed with patient that this elevated PSA may be the result of a prostate cancer, but without a biopsy we could not be sure - offered to continue to monitor the PSA yearly, increase monitoring frequency to q 6 or q 3 months and if PSA rises significantly pursue imaging studies to evaluate for metastasis or schedule a biopsy at this time - patient would like to continue to monitor his PSA q 6 months alternating between his PCP in January and [place] in July - he will return in July for PSA and exam  3. Abnormal prostate exam  - see above   4. Family  history of prostate cancer  - father with lethal prostate cancer    Return in about 6 months (around 04/05/2018) for IPSS, PSA and exam.  Zara Council, [facility]  [facility] [address] [place], [place] [address] [phone]

## 2017-10-06 ENCOUNTER — Encounter: Payer: Self-pay | Admitting: Urology

## 2017-10-06 ENCOUNTER — Ambulatory Visit: Payer: Medicare Other | Admitting: Urology

## 2017-10-06 VITALS — BP 135/71 | HR 64 | Ht 68.0 in | Wt 152.0 lb

## 2017-10-06 DIAGNOSIS — R3989 Other symptoms and signs involving the genitourinary system: Secondary | ICD-10-CM

## 2017-10-06 DIAGNOSIS — N401 Enlarged prostate with lower urinary tract symptoms: Secondary | ICD-10-CM

## 2017-10-06 DIAGNOSIS — N138 Other obstructive and reflux uropathy: Secondary | ICD-10-CM | POA: Diagnosis not present

## 2017-10-06 DIAGNOSIS — Z8042 Family history of malignant neoplasm of prostate: Secondary | ICD-10-CM | POA: Diagnosis not present

## 2017-10-06 DIAGNOSIS — R972 Elevated prostate specific antigen [PSA]: Secondary | ICD-10-CM | POA: Diagnosis not present

## 2017-10-06 MED ORDER — FINASTERIDE 5 MG PO TABS: 5.0000 mg | ORAL_TABLET | Freq: Every day | ORAL | 3 refills | Status: DC

## 2018-03-09 ENCOUNTER — Other Ambulatory Visit: Payer: Self-pay | Admitting: Family Medicine

## 2018-03-09 DIAGNOSIS — R972 Elevated prostate specific antigen [PSA]: Secondary | ICD-10-CM

## 2018-03-12 NOTE — Progress Notes (Signed)
9:39 AM   Aaron Grimes February 26, 1930 [id number]  Referring provider: Lenard Simmer, MD [address] [place], [place] [address]  Chief Complaint  Patient presents with  . Elevated PSA  . Benign Prostatic Hypertrophy    HPI: Patient is an 84 year old Caucasian male with an elevated PSA and BPH with LUTS who presents today for 12 month follow-up.  Elevated PSA PSA Trend  6.8 in 07/2015  6.4 in 08/2015  8.2 in 01/2016 - started finasteride  4.6 (11.2) in 05/2016  7.1 (14.2) in 02/2017 - 35% probability - patient stopped finasteride/restarted  6.4  (12.8) in 09/2017  7.4 (14.8) in 02/2018   BPH WITH LUTS His IPSS score today is 4, which is mild lower urinary tract symptomatology.  He is pleased with his quality life due to his urinary symptoms.  His previous I PSS score 6/1.  He has no urinary complaints at this time.  He is currently taking finasteride 5 mg daily.  He denies any dysuria, hematuria or suprapubic pain.   He also denies any recent fevers, chills, nausea or vomiting.  He has a family history of PCa, with his father having lethal prostate cancer.    IPSS    Row Name 03/16/18 0900         International Prostate Symptom Score   How often have you had the sensation of not emptying your bladder?  Not at All     How often have you had to urinate less than every two hours?  Not at All     How often have you found you stopped and started again several times when you urinated?  Less than 1 in 5 times     How often have you found it difficult to postpone urination?  Not at All     How often have you had a weak urinary stream?  About half the time     How often have you had to strain to start urination?  Not at All     How many times did you typically get up at night to urinate?  None     Total IPSS Score  4       Quality of Life due to urinary symptoms   If you were to spend the rest of your life with your urinary condition just the way it is now how would  you feel about that?  Pleased        Score:  1-7 Mild 8-19 Moderate 20-35 Severe  Family history of prostate cancer He has a family history of PCa, with his father having lethal prostate cancer.     PMH: Past Medical History:  Diagnosis Date  . Arthritis   . BPH (benign prostatic hypertrophy)   . Elevated PSA   . HLD (hyperlipidemia)   . HTN (hypertension)     Surgical History: Past Surgical History:  Procedure Laterality Date  . HAND SURGERY Right    nodule removed    Home Medications:  Allergies as of 03/16/2018   No Known Allergies     Medication List        Accurate as of 03/16/18  9:39 AM. Always use your most recent med list.          aspirin 325 MG tablet Take 325 mg by mouth daily.   clopidogrel 75 MG tablet Commonly known as:  PLAVIX Take 75 mg by mouth daily.   finasteride 5 MG tablet Commonly known as:  PROSCAR  Take 1 tablet (5 mg total) by mouth daily.   finasteride 5 MG tablet Commonly known as:  PROSCAR Take 1 tablet (5 mg total) by mouth daily.   FISH OIL PO Take by mouth.   lisinopril 5 MG tablet Commonly known as:  PRINIVIL,ZESTRIL Take 5 mg by mouth daily.   Vitamin D (Ergocalciferol) 50000 units Caps capsule Commonly known as:  DRISDOL Take 50,000 Units by mouth every 7 (seven) days.       Allergies: No Known Allergies  Family History: Family History  Problem Relation Age of Onset  . Prostate cancer Father        (not sure)  . Bone cancer Father   . Kidney disease Neg Hx   . Kidney cancer Neg Hx   . Bladder Cancer Neg Hx     Social History:  reports that he has never smoked. He has never used smokeless tobacco. He reports that he does not drink alcohol or use drugs.  ROS: UROLOGY Frequent Urination?: No Hard to postpone urination?: No Burning/pain with urination?: No Get up at night to urinate?: No Leakage of urine?: No Urine stream starts and stops?: No Trouble starting stream?: No Do you have to strain  to urinate?: No Blood in urine?: No Urinary tract infection?: No Sexually transmitted disease?: No Injury to kidneys or bladder?: No Painful intercourse?: No Weak stream?: Yes Erection problems?: Yes Penile pain?: No  Gastrointestinal Nausea?: No Vomiting?: No Indigestion/heartburn?: No Diarrhea?: No Constipation?: No  Constitutional Fever: No Night sweats?: No Weight loss?: No Fatigue?: No  Skin Skin rash/lesions?: No Itching?: No  Eyes Blurred vision?: No Double vision?: No  Ears/Nose/Throat Sore throat?: No Sinus problems?: No  Hematologic/Lymphatic Swollen glands?: No Easy bruising?: No  Cardiovascular Leg swelling?: No Chest pain?: No  Respiratory Cough?: No Shortness of breath?: No  Endocrine Excessive thirst?: No  Musculoskeletal Back pain?: No Joint pain?: No  Neurological Headaches?: No Dizziness?: No  Psychologic Depression?: No Anxiety?: No  Physical Exam: BP (!) 155/70 (BP Location: Right Arm, Patient Position: Sitting, Cuff Size: Normal)   Pulse 62   Ht 5\' 8"  (1.727 m)   Wt 155 lb 4.8 oz (70.4 kg)   SpO2 99%   BMI 23.61 kg/m   Constitutional: Well nourished. Alert and oriented, No acute distress. HEENT: [place] AT, moist mucus membranes. Trachea midline, no masses. Cardiovascular: No clubbing, cyanosis, or edema. Respiratory: Normal respiratory effort, no increased work of breathing. GI: Abdomen is soft, non tender, non distended, no abdominal masses. Liver and spleen not palpable.  No hernias appreciated.  Stool sample for occult testing is not indicated.   GU: No CVA tenderness.  No bladder fullness or masses.  Patient with circumcised phallus.   Urethral meatus is patent.  No penile discharge. No penile lesions or rashes. Scrotum without lesions, cysts, rashes and/or edema.  Testicles are located scrotally bilaterally. No masses are appreciated in the testicles. Left and right epididymis are normal. Rectal: Patient with  normal  sphincter tone. Anus and perineum without scarring or rashes. No rectal masses are appreciated. Prostate is approximately 45 grams, very firm area in the right lobe.  Seminal vesicles are normal. Skin: No rashes, bruises or suspicious lesions. Lymph: No cervical or inguinal adenopathy. Neurologic: Grossly intact, no focal deficits, moving all 4 extremities. Psychiatric: Normal mood and affect.    Laboratory Data: See HPI I have reviewed the labs.  Assessment & Plan:    1. BPH (benign prostatic hyperplasia) with LUTS:  IPSS score 4/1, it is mildly improved.  We will continue to monitor.  He will continue finasteride 5 mg daily and RTC in 3 months for I PSS, exam and PSA   2. Elevated PSA Most recent PSA was 7.4 (14.8) in 02/2018 I expressed my concerns again that the increasing PSA level is due to prostate cancer as he is on finasteride and in normal prostate tissue, finasteride should repress growth and in turn decrease the PSA level.  I recommended a prostate biopsy at this time, but the patient refused.   I discussed the risks of an undiagnosed and untreated prostate cancer.  He stated he understood these risks, but he stated his age of 21 it was not his desire to prolong his life.  He is feeling well and would like to pursue diagnosis and treatment only if he should become symptomatic.  He did agree to return in 3 months for PSA, exam and symptom recheck.  3. Abnormal prostate exam  - see above   4. Family history of prostate cancer  - father with lethal prostate cancer    Return in about 3 months (around 06/16/2018) for IPSS, PSA and exam.  Zara Council, [facility]  [place] [address] [place], [place] [address] [phone]

## 2018-03-13 ENCOUNTER — Other Ambulatory Visit: Payer: Medicare Other

## 2018-03-13 DIAGNOSIS — R972 Elevated prostate specific antigen [PSA]: Secondary | ICD-10-CM

## 2018-03-14 LAB — PSA: Prostate Specific Ag, Serum: 7.4 ng/mL — ABNORMAL HIGH (ref 0.0–4.0)

## 2018-03-16 ENCOUNTER — Ambulatory Visit: Payer: Medicare Other | Admitting: Urology

## 2018-03-16 ENCOUNTER — Encounter: Payer: Self-pay | Admitting: Urology

## 2018-03-16 VITALS — BP 155/70 | HR 62 | Ht 68.0 in | Wt 155.3 lb

## 2018-03-16 DIAGNOSIS — Z8042 Family history of malignant neoplasm of prostate: Secondary | ICD-10-CM | POA: Diagnosis not present

## 2018-03-16 DIAGNOSIS — R972 Elevated prostate specific antigen [PSA]: Secondary | ICD-10-CM | POA: Diagnosis not present

## 2018-03-16 DIAGNOSIS — R3989 Other symptoms and signs involving the genitourinary system: Secondary | ICD-10-CM | POA: Diagnosis not present

## 2018-03-16 DIAGNOSIS — N401 Enlarged prostate with lower urinary tract symptoms: Secondary | ICD-10-CM

## 2018-03-16 DIAGNOSIS — N138 Other obstructive and reflux uropathy: Secondary | ICD-10-CM | POA: Diagnosis not present

## 2018-03-16 MED ORDER — FINASTERIDE 5 MG PO TABS
5.0000 mg | ORAL_TABLET | Freq: Every day | ORAL | 3 refills | Status: DC
Start: 2018-03-16 — End: 2019-05-20

## 2018-06-12 ENCOUNTER — Other Ambulatory Visit: Payer: Medicare Other

## 2018-06-12 DIAGNOSIS — R972 Elevated prostate specific antigen [PSA]: Secondary | ICD-10-CM

## 2018-06-13 LAB — PSA: Prostate Specific Ag, Serum: 8.1 ng/mL — ABNORMAL HIGH (ref 0.0–4.0)

## 2018-06-15 ENCOUNTER — Encounter: Payer: Self-pay | Admitting: Urology

## 2018-06-15 ENCOUNTER — Ambulatory Visit: Payer: Medicare Other | Admitting: Urology

## 2018-06-15 VITALS — BP 150/75 | HR 69 | Ht 68.0 in | Wt 155.9 lb

## 2018-06-15 DIAGNOSIS — R3989 Other symptoms and signs involving the genitourinary system: Secondary | ICD-10-CM

## 2018-06-15 DIAGNOSIS — R972 Elevated prostate specific antigen [PSA]: Secondary | ICD-10-CM

## 2018-06-15 DIAGNOSIS — N138 Other obstructive and reflux uropathy: Secondary | ICD-10-CM

## 2018-06-15 DIAGNOSIS — Z8042 Family history of malignant neoplasm of prostate: Secondary | ICD-10-CM | POA: Diagnosis not present

## 2018-06-15 DIAGNOSIS — N401 Enlarged prostate with lower urinary tract symptoms: Secondary | ICD-10-CM | POA: Diagnosis not present

## 2018-06-15 LAB — BLADDER SCAN AMB NON-IMAGING: SCAN RESULT: 0

## 2018-06-15 NOTE — Progress Notes (Signed)
9:04 AM   Aaron Grimes 10-07-1929 [id number]  Referring provider: Lenard Simmer, MD [address] [place], [place] [address]  Chief Complaint  Patient presents with  . Follow-up    HPI: Patient is an 84 year old Caucasian male with an elevated PSA and BPH with LUTS who presents today for 3 month follow-up.  Elevated PSA PSA Trend  6.8 in 07/2015  6.4 in 08/2015  8.2 in 01/2016 - started finasteride  4.6 (11.2) in 05/2016  7.1 (14.2) in 02/2017 - 35% probability - patient stopped finasteride/restarted  6.4  (12.8) in 09/2017  7.4 (14.8) in 02/2018   8.1 (16.2) in 05/2018  BPH WITH LUTS His IPSS score today is 2, which is mild lower urinary tract symptomatology.  He is delighted with his quality life due to his urinary symptoms.  His previous I PSS score 4/1.  He has no urinary complaints at this time.  He is currently taking finasteride 5 mg daily.  He denies any dysuria, hematuria or suprapubic pain.   He also denies any recent fevers, chills, nausea or vomiting.  He has a family history of PCa, with his father having lethal prostate cancer.    IPSS    Row Name 06/15/18 0800         International Prostate Symptom Score   How often have you had the sensation of not emptying your bladder?  Not at All     How often have you had to urinate less than every two hours?  Not at All     How often have you found you stopped and started again several times when you urinated?  Not at All     How often have you found it difficult to postpone urination?  Not at All     How often have you had a weak urinary stream?  Less than half the time     How often have you had to strain to start urination?  Not at All     How many times did you typically get up at night to urinate?  None     Total IPSS Score  2       Quality of Life due to urinary symptoms   If you were to spend the rest of your life with your urinary condition just the way it is now how would you feel about  that?  Delighted        Score:  1-7 Mild 8-19 Moderate 20-35 Severe  Family history of prostate cancer He has a family history of PCa, with his father having lethal prostate cancer.     PMH: Past Medical History:  Diagnosis Date  . Arthritis   . BPH (benign prostatic hypertrophy)   . Elevated PSA   . HLD (hyperlipidemia)   . HTN (hypertension)     Surgical History: Past Surgical History:  Procedure Laterality Date  . HAND SURGERY Right    nodule removed    Home Medications:  Allergies as of 06/15/2018   No Known Allergies     Medication List        Accurate as of 06/15/18  9:04 AM. Always use your most recent med list.          aspirin 325 MG tablet Take 325 mg by mouth daily.   clopidogrel 75 MG tablet Commonly known as:  PLAVIX Take 75 mg by mouth daily.   finasteride 5 MG tablet Commonly known as:  PROSCAR Take 1 tablet (  5 mg total) by mouth daily.   FISH OIL PO Take by mouth.   lisinopril 5 MG tablet Commonly known as:  PRINIVIL,ZESTRIL Take 5 mg by mouth daily.   rosuvastatin 20 MG tablet Commonly known as:  CRESTOR Take 20 mg by mouth daily.   Vitamin D (Ergocalciferol) 50000 units Caps capsule Commonly known as:  DRISDOL Take 50,000 Units by mouth every 7 (seven) days.       Allergies: No Known Allergies  Family History: Family History  Problem Relation Age of Onset  . Prostate cancer Father        (not sure)  . Bone cancer Father   . Kidney disease Neg Hx   . Kidney cancer Neg Hx   . Bladder Cancer Neg Hx     Social History:  reports that he has never smoked. He has never used smokeless tobacco. He reports that he does not drink alcohol or use drugs.  ROS: UROLOGY Frequent Urination?: No Hard to postpone urination?: No Burning/pain with urination?: No Get up at night to urinate?: No Leakage of urine?: No Urine stream starts and stops?: No Trouble starting stream?: No Do you have to strain to urinate?: No Blood in  urine?: No Urinary tract infection?: No Sexually transmitted disease?: No Injury to kidneys or bladder?: No Painful intercourse?: No Weak stream?: No Erection problems?: No Penile pain?: No  Gastrointestinal Nausea?: No Vomiting?: No Indigestion/heartburn?: No Diarrhea?: No Constipation?: No  Constitutional Fever: No Night sweats?: No Weight loss?: No Fatigue?: No  Skin Skin rash/lesions?: No Itching?: No  Eyes Blurred vision?: No Double vision?: No  Ears/Nose/Throat Sore throat?: No Sinus problems?: No  Hematologic/Lymphatic Swollen glands?: No Easy bruising?: No  Cardiovascular Leg swelling?: No Chest pain?: No  Respiratory Cough?: No Shortness of breath?: No  Endocrine Excessive thirst?: No  Musculoskeletal Back pain?: No Joint pain?: No  Neurological Headaches?: No Dizziness?: No  Psychologic Depression?: No Anxiety?: No  Physical Exam: BP (!) 150/75 (BP Location: Left Arm, Patient Position: Sitting, Cuff Size: Normal)   Pulse 69   Ht 5\' 8"  (1.727 m)   Wt 155 lb 14.4 oz (70.7 kg)   BMI 23.70 kg/m   Constitutional: Well nourished. Alert and oriented, No acute distress. HEENT: [place] AT, moist mucus membranes. Trachea midline, no masses. Cardiovascular: No clubbing, cyanosis, or edema. Respiratory: Normal respiratory effort, no increased work of breathing. GI: Abdomen is soft, non tender, non distended, no abdominal masses. Liver and spleen not palpable.  No hernias appreciated.  Stool sample for occult testing is not indicated.   GU: No CVA tenderness.  No bladder fullness or masses.  Patient with uncircumcised phallus.  Foreskin easily retracted Urethral meatus is patent.  No penile discharge. No penile lesions or rashes. Scrotum without lesions, cysts, rashes and/or edema.  Testicles are located scrotally bilaterally. No masses are appreciated in the testicles. Left and right epididymis are normal. Rectal: Patient with  normal sphincter  tone. Anus and perineum without scarring or rashes. No rectal masses are appreciated. Prostate is approximately 50 grams, 10 mm x 10 mm nodule is appreciated in the right lobe.  Seminal vesicles are normal. Skin: No rashes, bruises or suspicious lesions. Lymph: No cervical or inguinal adenopathy. Neurologic: Grossly intact, no focal deficits, moving all 4 extremities. Psychiatric: Normal mood and affect.  Laboratory Data: See HPI I have reviewed the labs.  Assessment & Plan:    1. BPH (benign prostatic hyperplasia) with LUTS:   IPSS score 2/0, it is  mildly improved.  We will continue to monitor.  He will continue finasteride 5 mg daily and RTC in 6 months for I PSS, exam and PSA   2. Elevated PSA Most recent PSA was 8.1 (16.2) in 05/2018 Revisited my concerns again that the increasing PSA level is due to prostate cancer as he is on finasteride and in normal prostate tissue, finasteride should repress growth and in turn decrease the PSA level.  He also had a prostate nodule.  I recommended a prostate biopsy at this time, but the patient refused.   I discussed the risks of an undiagnosed and untreated prostate cancer - such as pathological fractures that could cause severe medical sequelae.  He stated he understood these risks, but he stated his age of 89 it was not his desire to prolong his life.  He is feeling well and would like to pursue diagnosis and treatment only if he should become symptomatic.  He did agree to return in 6 months for PSA, exam and symptom recheck.  3. Abnormal prostate exam  - see above   4. Family history of prostate cancer  - father with lethal prostate cancer    Return in about 6 months (around 12/14/2018) for IPSS, PSA and exam.  Zara Council, [facility]  [place] [address] [place], [place] [address] [phone]

## 2018-12-09 ENCOUNTER — Other Ambulatory Visit: Payer: Self-pay

## 2018-12-09 DIAGNOSIS — R972 Elevated prostate specific antigen [PSA]: Secondary | ICD-10-CM

## 2018-12-10 ENCOUNTER — Other Ambulatory Visit: Payer: Self-pay

## 2018-12-10 ENCOUNTER — Other Ambulatory Visit: Payer: Medicare Other

## 2018-12-10 DIAGNOSIS — R972 Elevated prostate specific antigen [PSA]: Secondary | ICD-10-CM

## 2018-12-11 LAB — PSA: Prostate Specific Ag, Serum: 10.4 ng/mL — ABNORMAL HIGH (ref 0.0–4.0)

## 2018-12-15 ENCOUNTER — Ambulatory Visit: Payer: Medicare Other | Admitting: Urology

## 2018-12-29 ENCOUNTER — Telehealth (INDEPENDENT_AMBULATORY_CARE_PROVIDER_SITE_OTHER): Payer: Medicare Other | Admitting: Urology

## 2018-12-29 ENCOUNTER — Telehealth: Payer: Self-pay | Admitting: Urology

## 2018-12-29 ENCOUNTER — Other Ambulatory Visit: Payer: Self-pay

## 2018-12-29 DIAGNOSIS — R972 Elevated prostate specific antigen [PSA]: Secondary | ICD-10-CM | POA: Diagnosis not present

## 2018-12-29 NOTE — Telephone Encounter (Signed)
Would you call Aaron Grimes and push his May appointment out to August?  PSA prior.

## 2018-12-29 NOTE — Progress Notes (Signed)
Virtual Visit via Telephone Note  I connected with Aaron Grimes on 12/29/2018 at 1438 by telephone and verified that I am speaking with the correct person using two identifiers.  They are located at home.  I am located at my home.    This visit type was conducted due to national recommendations for restrictions regarding the COVID-19 Pandemic (e.g. social distancing).  This format is felt to be most appropriate for this patient at this time.  All issues noted in this document were discussed and addressed.  No physical exam was performed.   I discussed the limitations, risks, security and privacy concerns of performing an evaluation and management service by telephone and the availability of in person appointments. I also discussed with the patient that there may be a patient responsible charge related to this service. The patient expressed understanding and agreed to proceed.   History of Present Illness: Aaron Grimes is an 85 year old male with an elevated PSA who was contacted to discuss his current PSA value and possible next steps.  He states that he is currently taking the finasteride 5 mg daily.  He has not missed a dose to his knowledge.  He has no urinary complaints at this time.  Patient denies any gross hematuria, dysuria or suprapubic/flank pain.  Patient denies any fevers, chills, nausea or vomiting.     He does have lower back pain, but it has been present for over twenty years.  It is also worse when he first wakes in the morning and is relieved as the day goes on which is more consistent with arthritis.      Observations/Objective:  6.8 in 07/2015             6.4 in 08/2015             8.2 in 01/2016 - started finasteride             4.6 (11.2) in 05/2016             7.1 (14.2) in 02/2017 - 35% probability - patient stopped finasteride/restarted             6.4  (12.8) in 09/2017             7.4 (14.8) in 02/2018              8.1 (16.2) in 05/2018  10.4 (20.8) in  11/2018  Assessment and Plan: 1. Elevated PSA Explained to the patient that his corrected value of PSA is actually 20.8 due to him taking finasteride which reduces the PSA by 50% and that this continual increase of his PSA while taking finasteride is likely caused by prostate cancer.  Due to his life expectancy of 5 years or less if he were to have prostate cancer at this time, the NCCN guidelines would recommend continual observation as long as he is asymptomatic, ADT or EBRT.  If he would like to pursue treatment of some kind for a probable prostate cancer, I would recommend undergoing an MRI of prostate if not cost prohibitive to see if any areas of concern for high-grade prostate cancer were visualized.  If lesions suspicious for high-grade cancer were found on MRI, we would then pursue a prostate biopsy prior to beginning either ADT or EBRT.  At this time as patient is asymptomatic, he would like to continue observation with periodic monitoring of his PSA.  He would like to follow-up in 6 months and have a recheck on his PSA/DRE  and I find this reasonable given his advanced age   Follow Up Instructions:  Patient to follow up in 6 months for PSA and exam.   I discussed the assessment and treatment plan with the patient. The patient was provided an opportunity to ask questions and all were answered. The patient agreed with the plan and demonstrated an understanding of the instructions.   The patient was advised to call back or seek an in-person evaluation if the symptoms worsen or if the condition fails to improve as anticipated.  I provided 13 minutes of non-face-to-face time during this encounter.   Kineta Fudala, PA-C

## 2019-02-17 ENCOUNTER — Ambulatory Visit: Payer: Medicare Other | Admitting: Urology

## 2019-05-17 ENCOUNTER — Other Ambulatory Visit: Payer: Self-pay

## 2019-05-17 ENCOUNTER — Other Ambulatory Visit: Payer: Medicare Other

## 2019-05-17 DIAGNOSIS — R972 Elevated prostate specific antigen [PSA]: Secondary | ICD-10-CM

## 2019-05-18 LAB — PSA: Prostate Specific Ag, Serum: 15.4 ng/mL — ABNORMAL HIGH (ref 0.0–4.0)

## 2019-05-19 NOTE — Progress Notes (Signed)
9:20 AM   Staley Lunz Mar 02, 1930 [id number]  Referring provider: Lenard Simmer, MD [address] [place],  [place] [address]  Chief Complaint  Patient presents with  . Elevated PSA    HPI: Patient is an 85 year old male with an elevated PSA and BPH with LUTS who presents today for 6 month follow-up.  Elevated PSA PSA Trend  6.8 in 07/2015  6.4 in 08/2015  8.2 in 01/2016 - started finasteride  4.6 (11.2) in 05/2016  7.1 (14.2) in 02/2017 - 35% probability - patient stopped finasteride/restarted  6.4  (12.8) in 09/2017  7.4 (14.8) in 02/2018   8.1 (16.2) in 05/2018  10.4 (20.8) in 11/2018  15.4 (30.8) in 04/2019  BPH WITH LUTS His IPSS score today is 3, which is mild lower urinary tract symptomatology. He is delighted with his quality life due to his urinary symptoms.  His previous I PSS score 2/0.  He has no urinary complaints at this time.  He continues to take finasteride 5 mg daily.  He denies any dysuria, hematuria or suprapubic pain.   He also denies any recent fevers, chills, nausea or vomiting.   IPSS    Row Name 05/20/19 0900         International Prostate Symptom Score   How often have you had the sensation of not emptying your bladder?  Not at All     How often have you had to urinate less than every two hours?  Not at All     How often have you found you stopped and started again several times when you urinated?  Not at All     How often have you found it difficult to postpone urination?  Not at All     How often have you had a weak urinary stream?  About half the time     How often have you had to strain to start urination?  Not at All     How many times did you typically get up at night to urinate?  None     Total IPSS Score  3       Quality of Life due to urinary symptoms   If you were to spend the rest of your life with your urinary condition just the way it is now how would you feel about that?  Delighted        Score:  1-7 Mild  8-19 Moderate 20-35 Severe  Family history of prostate cancer He has a family history of PCa, with his father having lethal prostate cancer.     PMH: Past Medical History:  Diagnosis Date  . Arthritis   . BPH (benign prostatic hypertrophy)   . Elevated PSA   . HLD (hyperlipidemia)   . HTN (hypertension)     Surgical History: Past Surgical History:  Procedure Laterality Date  . HAND SURGERY Right    nodule removed    Home Medications:  Allergies as of 05/20/2019   No Known Allergies     Medication List       Accurate as of May 20, 2019  9:20 AM. If you have any questions, ask your nurse or doctor.        aspirin 325 MG tablet Take 325 mg by mouth daily.   clopidogrel 75 MG tablet Commonly known as: PLAVIX Take 75 mg by mouth daily.   finasteride 5 MG tablet Commonly known as: Proscar Take 1 tablet (5 mg total) by mouth daily.  FISH OIL PO Take by mouth.   lisinopril 5 MG tablet Commonly known as: ZESTRIL Take 5 mg by mouth daily.   lisinopril 10 MG tablet Commonly known as: ZESTRIL   rosuvastatin 20 MG tablet Commonly known as: CRESTOR Take 20 mg by mouth daily.   Vitamin D (Ergocalciferol) 1.25 MG (50000 UT) Caps capsule Commonly known as: DRISDOL Take 50,000 Units by mouth every 7 (seven) days.       Allergies: No Known Allergies  Family History: Family History  Problem Relation Age of Onset  . Prostate cancer Father        (not sure)  . Bone cancer Father   . Kidney disease Neg Hx   . Kidney cancer Neg Hx   . Bladder Cancer Neg Hx     Social History:  reports that he has never smoked. He has never used smokeless tobacco. He reports that he does not drink alcohol or use drugs.  ROS: UROLOGY Frequent Urination?: No Hard to postpone urination?: No Burning/pain with urination?: No Get up at night to urinate?: No Leakage of urine?: No Urine stream starts and stops?: No Trouble starting stream?: No Do you have to strain to  urinate?: No Blood in urine?: No Urinary tract infection?: No Sexually transmitted disease?: No Injury to kidneys or bladder?: No Painful intercourse?: No Weak stream?: No Erection problems?: No Penile pain?: No  Gastrointestinal Nausea?: No Vomiting?: No Indigestion/heartburn?: No Diarrhea?: No Constipation?: No  Constitutional Fever: No Night sweats?: No Weight loss?: No Fatigue?: No  Skin Skin rash/lesions?: No Itching?: No  Eyes Blurred vision?: No Double vision?: No  Ears/Nose/Throat Sore throat?: No Sinus problems?: No  Hematologic/Lymphatic Swollen glands?: No Easy bruising?: Yes  Cardiovascular Leg swelling?: No Chest pain?: No  Respiratory Cough?: No  Endocrine Excessive thirst?: No  Musculoskeletal Back pain?: No Joint pain?: No  Neurological Headaches?: No Dizziness?: Yes  Psychologic Depression?: No Anxiety?: No  Physical Exam: Ht 5\' 8"  (1.727 m)   Wt 150 lb (68 kg)   BMI 22.81 kg/m   Constitutional:  Well nourished. Alert and oriented, No acute distress. HEENT: [place] AT, moist mucus membranes.  Trachea midline, no masses. Cardiovascular: No clubbing, cyanosis, or edema. Respiratory: Normal respiratory effort, no increased work of breathing. Neurologic: Grossly intact, no focal deficits, moving all 4 extremities. Psychiatric: Normal mood and affect.  Laboratory Data: See HPI I have reviewed the labs.  Assessment & Plan:    1. BPH (benign prostatic hyperplasia) with LUTS:   IPSS score 3/0, it is mildly worse.  We will continue to monitor.  He will continue finasteride 5 mg daily and RTC in 6 months for I PSS, exam and PSA   2. Elevated PSA Most recent PSA was 15.4 (30.8) in 04/2019 Revisited my concerns again that the increasing PSA level is due to prostate cancer as he is on finasteride and in normal prostate tissue, finasteride should repress growth and in turn decrease the PSA level.  He also had a prostate nodule.  He  would like to continue with observation and not intervene until he has worsening urinary symptoms, bone pain, anorexia and/or significant weight loss.  I did discuss metastatic prostate cancer resulting in a pathological fracture, which would be associated with increased morbidity, and he voiced his understanding and does not want to pursue further investigation into his elevated PSA  3. Family history of prostate cancer  - father with lethal prostate cancer    Return in about 6 months (around  11/20/2019) for IPSS, PSA and exam.  Zara Council, [facility]  [place] [address] [place], [place] [address] [phone]

## 2019-05-20 ENCOUNTER — Other Ambulatory Visit: Payer: Self-pay

## 2019-05-20 ENCOUNTER — Encounter: Payer: Self-pay | Admitting: Urology

## 2019-05-20 ENCOUNTER — Ambulatory Visit (INDEPENDENT_AMBULATORY_CARE_PROVIDER_SITE_OTHER): Payer: Medicare Other | Admitting: Urology

## 2019-05-20 VITALS — Ht 68.0 in | Wt 150.0 lb

## 2019-05-20 DIAGNOSIS — N401 Enlarged prostate with lower urinary tract symptoms: Secondary | ICD-10-CM

## 2019-05-20 DIAGNOSIS — R3989 Other symptoms and signs involving the genitourinary system: Secondary | ICD-10-CM

## 2019-05-20 DIAGNOSIS — R972 Elevated prostate specific antigen [PSA]: Secondary | ICD-10-CM

## 2019-05-20 DIAGNOSIS — Z8042 Family history of malignant neoplasm of prostate: Secondary | ICD-10-CM

## 2019-05-20 DIAGNOSIS — N138 Other obstructive and reflux uropathy: Secondary | ICD-10-CM

## 2019-05-20 MED ORDER — FINASTERIDE 5 MG PO TABS: 5.0000 mg | ORAL_TABLET | Freq: Every day | ORAL | 3 refills | Status: DC

## 2019-10-01 ENCOUNTER — Ambulatory Visit: Payer: Medicare Other | Attending: Internal Medicine

## 2019-10-01 DIAGNOSIS — Z20822 Contact with and (suspected) exposure to covid-19: Secondary | ICD-10-CM

## 2019-10-03 ENCOUNTER — Telehealth: Payer: Self-pay | Admitting: Adult Health

## 2019-10-03 LAB — NOVEL CORONAVIRUS, NAA: SARS-CoV-2, NAA: DETECTED — AB

## 2019-10-03 NOTE — Telephone Encounter (Signed)
Patient called about Positive Covid test.  2 patient identifiers confirmed.  Date Tested: 10/01/2019  Date of Symptom onset: 09/30/2019   Symptoms: mild, low grade fever, headache   Isolation Recommendations:  Patient understands the needs to stay in isolation for a total of 10 days from onset of symptom or 14 days total from date of testing if no symptom. Reviewed Masking.    Supportive Care Recommendations: Encouraged plenty of fluid intake, Tylenol per package directions, and to remain as active as possible.    Patient knows the health department may be in touch.    I answered all of patient's questions and all concerns addressed.  ER precautions reviewed.  Time Spent: 9 minutes  Wilber Bihari, NP

## 2019-10-04 ENCOUNTER — Telehealth: Payer: Self-pay | Admitting: Nurse Practitioner

## 2019-10-04 ENCOUNTER — Other Ambulatory Visit: Payer: Self-pay | Admitting: Nurse Practitioner

## 2019-10-04 DIAGNOSIS — U071 COVID-19: Secondary | ICD-10-CM

## 2019-10-04 NOTE — Telephone Encounter (Signed)
Yes will call him

## 2019-10-04 NOTE — Telephone Encounter (Signed)
Called to Discuss with patient about Covid symptoms and the use of bamlanivimab, a monoclonal antibody infusion for those with mild to moderate Covid symptoms and at a high risk of hospitalization.     Pt is qualified for this infusion at the [facility] infusion center due to co-morbid conditions and/or a member of an at-risk group.     Patient Active Problem List   Diagnosis Date Noted  . BPH with obstruction/lower urinary tract symptoms 08/24/2015  . Elevated PSA 08/24/2015    Patient declines infusion at this time. States that he would like to think about it and call us back. Symptoms tier reviewed as well as criteria for ending isolation. Preventative practices reviewed. Patient verbalized understanding.    Patient advised to call back if he decides that he does want to get infusion. Callback number to the infusion center given. Patient advised to go to Urgent care or ED with severe symptoms.

## 2019-10-04 NOTE — Progress Notes (Signed)
  I connected by phone with Aaron Grimes on 10/04/2019 at 2:51 PM to discuss the potential use of an new treatment for mild to moderate COVID-19 viral infection in non-hospitalized patients.  This patient is a 86 y.o. male that meets the FDA criteria for Emergency Use Authorization of bamlanivimab or casirivimab\imdevimab.  Has a (+) direct SARS-CoV-2 viral test result  Has mild or moderate COVID-19   Is ? 84 years of age and weighs ? 40 kg  Is NOT hospitalized due to COVID-19  Is NOT requiring oxygen therapy or requiring an increase in baseline oxygen flow rate due to COVID-19  Is within 10 days of symptom onset  Has at least one of the high risk factor(s) for progression to severe COVID-19 and/or hospitalization as defined in EUA.  Specific high risk criteria : >/= 84 yo   I have spoken and communicated the following to the patient or parent/caregiver:  1. FDA has authorized the emergency use of bamlanivimab and casirivimab\imdevimab for the treatment of mild to moderate COVID-19 in adults and pediatric patients with positive results of direct SARS-CoV-2 viral testing who are 19 years of age and older weighing at least 40 kg, and who are at high risk for progressing to severe COVID-19 and/or hospitalization.  2. The significant known and potential risks and benefits of bamlanivimab and casirivimab\imdevimab, and the extent to which such potential risks and benefits are unknown.  3. Information on available alternative treatments and the risks and benefits of those alternatives, including clinical trials.  4. Patients treated with bamlanivimab and casirivimab\imdevimab should continue to self-isolate and use infection control measures (e.g., wear mask, isolate, social distance, avoid sharing personal items, clean and disinfect "high touch" surfaces, and frequent handwashing) according to CDC guidelines.   5. The patient or parent/caregiver has the option to accept or refuse  bamlanivimab or casirivimab\imdevimab .  After reviewing this information with the patient, The patient agreed to proceed with receiving the bamlanimivab infusion and will be provided a copy of the Fact sheet prior to receiving the infusion.[name] 10/04/2019 2:51 PM

## 2019-10-07 ENCOUNTER — Ambulatory Visit (HOSPITAL_COMMUNITY): Payer: Medicare Other

## 2019-10-12 ENCOUNTER — Ambulatory Visit: Payer: Medicare Other | Attending: Internal Medicine

## 2019-10-12 DIAGNOSIS — Z20822 Contact with and (suspected) exposure to covid-19: Secondary | ICD-10-CM

## 2019-10-13 LAB — NOVEL CORONAVIRUS, NAA: SARS-CoV-2, NAA: DETECTED — AB

## 2019-11-19 ENCOUNTER — Other Ambulatory Visit: Payer: Self-pay | Admitting: Family Medicine

## 2019-11-19 DIAGNOSIS — N138 Other obstructive and reflux uropathy: Secondary | ICD-10-CM

## 2019-11-19 DIAGNOSIS — N401 Enlarged prostate with lower urinary tract symptoms: Secondary | ICD-10-CM

## 2019-11-19 DIAGNOSIS — R972 Elevated prostate specific antigen [PSA]: Secondary | ICD-10-CM

## 2019-11-22 ENCOUNTER — Other Ambulatory Visit: Payer: Self-pay

## 2019-11-22 ENCOUNTER — Other Ambulatory Visit: Payer: Medicare Other

## 2019-11-22 DIAGNOSIS — R972 Elevated prostate specific antigen [PSA]: Secondary | ICD-10-CM

## 2019-11-22 DIAGNOSIS — N138 Other obstructive and reflux uropathy: Secondary | ICD-10-CM

## 2019-11-22 DIAGNOSIS — N401 Enlarged prostate with lower urinary tract symptoms: Secondary | ICD-10-CM

## 2019-11-23 LAB — PSA: Prostate Specific Ag, Serum: 17.8 ng/mL — ABNORMAL HIGH (ref 0.0–4.0)

## 2019-11-25 ENCOUNTER — Other Ambulatory Visit: Payer: Self-pay

## 2019-11-25 ENCOUNTER — Encounter: Payer: Self-pay | Admitting: Urology

## 2019-11-25 ENCOUNTER — Other Ambulatory Visit: Payer: Self-pay | Admitting: Family Medicine

## 2019-11-25 ENCOUNTER — Ambulatory Visit: Payer: Medicare Other | Admitting: Urology

## 2019-11-25 VITALS — BP 173/69 | HR 46

## 2019-11-25 DIAGNOSIS — Z8042 Family history of malignant neoplasm of prostate: Secondary | ICD-10-CM

## 2019-11-25 DIAGNOSIS — R972 Elevated prostate specific antigen [PSA]: Secondary | ICD-10-CM

## 2019-11-25 DIAGNOSIS — N401 Enlarged prostate with lower urinary tract symptoms: Secondary | ICD-10-CM | POA: Diagnosis not present

## 2019-11-25 DIAGNOSIS — N138 Other obstructive and reflux uropathy: Secondary | ICD-10-CM

## 2019-11-25 DIAGNOSIS — R3989 Other symptoms and signs involving the genitourinary system: Secondary | ICD-10-CM | POA: Diagnosis not present

## 2019-11-25 NOTE — Progress Notes (Signed)
10:25 AM   Aaron Grimes Feb 04, 1930 [id number]  Referring provider: Lenard Simmer, MD [address] [place],  [name] [address]  Chief Complaint  Patient presents with  . Benign Prostatic Hypertrophy    HPI: Patient is an 86 year old male with an elevated PSA and BPH with LUTS who presents today for 6 month follow-up.  Elevated PSA PSA Trend  6.8 in 07/2015  6.4 in 08/2015  8.2 in 01/2016 - started finasteride  4.6 (11.2) in 05/2016  7.1 (14.2) in 02/2017 - 35% probability - patient stopped finasteride/restarted  6.4  (12.8) in 09/2017  7.4 (14.8) in 02/2018   8.1 (16.2) in 05/2018  10.4 (20.8) in 11/2018  15.4 (30.8) in 04/2019  17.8 (35.6) in 11/2019    BPH WITH LUTS His IPSS score today is 0, which is no lower urinary tract symptomatology. He is delighted with his quality life due to his urinary symptoms.  His previous I PSS score 3/0.  He has no urinary complaints at this time.  He continues to take finasteride 5 mg daily.   Patient denies any modifying or aggravating factors.  Patient denies any gross hematuria, dysuria or suprapubic/flank pain.  Patient denies any fevers, chills, nausea or vomiting.     IPSS    Row Name 11/25/19 0900         International Prostate Symptom Score   How often have you had to urinate less than every two hours?  Not at All     How often have you found you stopped and started again several times when you urinated?  Not at All     How often have you found it difficult to postpone urination?  Not at All     How often have you had a weak urinary stream?  Not at All     How often have you had to strain to start urination?  Not at All     How many times did you typically get up at night to urinate?  None     Total IPSS Score  0       Quality of Life due to urinary symptoms   If you were to spend the rest of your life with your urinary condition just the way it is now how would you feel about that?  Delighted         Score:  1-7 Mild 8-19 Moderate 20-35 Severe  Family history of prostate cancer He has a family history of PCa, with his father having lethal prostate cancer.     PMH: Past Medical History:  Diagnosis Date  . Arthritis   . BPH (benign prostatic hypertrophy)   . Elevated PSA   . HLD (hyperlipidemia)   . HTN (hypertension)     Surgical History: Past Surgical History:  Procedure Laterality Date  . HAND SURGERY Right    nodule removed    Home Medications:  Allergies as of 11/25/2019   No Known Allergies     Medication List       Accurate as of November 25, 2019 10:25 AM. If you have any questions, ask your nurse or doctor.        aspirin 325 MG tablet Take 325 mg by mouth daily.   clopidogrel 75 MG tablet Commonly known as: PLAVIX Take 75 mg by mouth daily.   finasteride 5 MG tablet Commonly known as: Proscar Take 1 tablet (5 mg total) by mouth daily.   FISH OIL PO Take  by mouth.   lisinopril 10 MG tablet Commonly known as: ZESTRIL What changed: Another medication with the same name was removed. Continue taking this medication, and follow the directions you see here. Changed by: Zara Council, PA-C   rosuvastatin 20 MG tablet Commonly known as: CRESTOR Take 20 mg by mouth daily.   Vitamin D (Ergocalciferol) 1.25 MG (50000 UNIT) Caps capsule Commonly known as: DRISDOL Take 50,000 Units by mouth every 7 (seven) days.       Allergies: No Known Allergies  Family History: Family History  Problem Relation Age of Onset  . Prostate cancer Father        (not sure)  . Bone cancer Father   . Kidney disease Neg Hx   . Kidney cancer Neg Hx   . Bladder Cancer Neg Hx     Social History:  reports that he has never smoked. He has never used smokeless tobacco. He reports that he does not drink alcohol or use drugs.  ROS: For pertinent review of systems please refer to history of present illness  Physical Exam: BP (!) 173/69   Pulse (!) 46    Constitutional:  Well nourished. Alert and oriented, No acute distress. HEENT: [place] AT, mask in place.  Trachea midline, no masses. Cardiovascular: No clubbing, cyanosis, or edema. Respiratory: Normal respiratory effort, no increased work of breathing. GI: Abdomen is soft, non tender, non distended, no abdominal masses.  GU: No CVA tenderness.  No bladder fullness or masses.  Patient with circumcised phallus.  Urethral meatus is patent.  No penile discharge. No penile lesions or rashes. Scrotum without lesions, cysts, rashes and/or edema.  Testicles are located scrotally bilaterally. No masses are appreciated in the testicles. Left and right epididymis are normal. Rectal: Patient with  normal sphincter tone. Anus and perineum without scarring or rashes. No rectal masses are appreciated. Prostate is approximately 50 grams, nodular.  Seminal vesicles could not be palpated Skin: No rashes, bruises or suspicious lesions. Lymph: No inguinal adenopathy. Neurologic: Grossly intact, no focal deficits, moving all 4 extremities. Psychiatric: Normal mood and affect.  Laboratory Data: See HPI I have reviewed the labs.  Assessment & Plan:    1. Elevated PSA/Nodular prostate I explained to [name] that with the increasing PSA while on finasteride and his nodular prostate, he is what would be called, clinical prostate cancer.  Although we do not have a pathological diagnosis, it is most likely a high grade prostate cancer which likely has metastatic lesions.  We discussed that prostate cancer typically metastasized to the lymph nodes and into the bones.  When the cancer metastasized to the bones, the bones to become very brittle and he could suffer a fracture of his leg by just walking across the kitchen floor or he could experience cauda equina syndrome which resulted pain and incontinence of stool and urine.  If either of these things should happen, he would suffer severe sequela and possibly death.  He  states he is well aware of the consequences of not addressing his prostate cancer as his father had prostate cancer which metastasized to the bone and is very familiar with the disease process, so he is not going into this with his eyes closed.  He does not wish to undergo a prostate biopsy, prostate MRI or any other imaging studies to further evaluate his status at this time.  He states he does not want to intervene until he experiences bothersome urinary symptoms, gross hematuria, pathological fracture, significant weight loss,  anorexia or feeling bad.  He states right now he feels good.  He is not having any bone pain and has a good appetite.  He is not experiencing any urinary issues or hematuria.  At this time, he would like to continue to be seen every 6 months so that we can continue to monitor him clinically.    2. BPH with LUTS IPSS score is 0/0, it is improving Continue conservative management, avoiding bladder irritants and timed voiding's Continue finasteride 5 mg daily - refills given RTC in 6 months for IPSS, PSA and exam    3. Family history of prostate cancer  - father with lethal prostate cancer    Return in about 6 months (around 05/27/2020) for IPSS, PSA and exam.  Zara Council, [facility]  [place] [address] [place], [place] [address] [phone]

## 2020-01-10 ENCOUNTER — Other Ambulatory Visit: Payer: Self-pay

## 2020-01-10 ENCOUNTER — Ambulatory Visit: Payer: Medicare Other | Attending: Internal Medicine

## 2020-01-10 DIAGNOSIS — Z23 Encounter for immunization: Secondary | ICD-10-CM

## 2020-01-10 NOTE — Progress Notes (Signed)
   Covid-19 Vaccination Clinic  Name:  Aaron Grimes    MRN: [id number] DOB: Feb 07, 1930  01/10/2020  Aaron Grimes was observed post Covid-19 immunization for 15 minutes without incident. He was provided with Vaccine Information Sheet and instruction to access the V-Safe system.   Aaron Grimes was instructed to call 911 with any severe reactions post vaccine: Marland Kitchen Difficulty breathing  . Swelling of face and throat  . A fast heartbeat  . A bad rash all over body  . Dizziness and weakness   Immunizations Administered    Name Date Dose VIS Date Route   Pfizer COVID-19 Vaccine 01/10/2020 11:24 AM 0.3 mL 11/17/2018 Intramuscular   Manufacturer: [employer]   Lot: [id number]   [place]: [id number]

## 2020-02-02 ENCOUNTER — Ambulatory Visit: Payer: Medicare Other | Attending: Internal Medicine

## 2020-02-02 DIAGNOSIS — Z23 Encounter for immunization: Secondary | ICD-10-CM

## 2020-02-02 NOTE — Progress Notes (Signed)
   Covid-19 Vaccination Clinic  Name:  Aaron Grimes    MRN: [id number] DOB: 06/24/1930  02/02/2020  Aaron Grimes was observed post Covid-19 immunization for 15 minutes without incident. He was provided with Vaccine Information Sheet and instruction to access the V-Safe system.   Aaron Grimes was instructed to call 911 with any severe reactions post vaccine: Marland Kitchen Difficulty breathing  . Swelling of face and throat  . A fast heartbeat  . A bad rash all over body  . Dizziness and weakness   Immunizations Administered    Name Date Dose VIS Date Route   Pfizer COVID-19 Vaccine 02/02/2020 11:27 AM 0.3 mL 11/17/2018 Intramuscular   Manufacturer: [place]   Lot: [id number]   [place]: [id number]

## 2020-05-26 ENCOUNTER — Other Ambulatory Visit: Payer: Medicare Other

## 2020-05-26 ENCOUNTER — Other Ambulatory Visit: Payer: Self-pay

## 2020-05-26 DIAGNOSIS — R972 Elevated prostate specific antigen [PSA]: Secondary | ICD-10-CM

## 2020-05-27 LAB — PSA: Prostate Specific Ag, Serum: 20.5 ng/mL — ABNORMAL HIGH (ref 0.0–4.0)

## 2020-05-30 ENCOUNTER — Ambulatory Visit: Payer: Self-pay | Admitting: Urology

## 2020-05-30 NOTE — Progress Notes (Signed)
1:57 PM   Aaron Grimes 03-Jan-1930 [id number]  Referring provider: Lenard Simmer, MD [address] [place],  [place] [address]  Chief Complaint  Patient presents with  . Follow-up    HPI: Patient is an 86 year old male with an elevated PSA and BPH with LUTS who presents today for 6 month follow-up.  Elevated PSA PSA Trend  6.8 in 07/2015  6.4 in 08/2015  8.2 in 01/2016 - started finasteride  4.6 (11.2) in 05/2016  7.1 (14.2) in 02/2017 - 35% probability - patient stopped finasteride/restarted  6.4  (12.8) in 09/2017  7.4 (14.8) in 02/2018   8.1 (16.2) in 05/2018  10.4 (20.8) in 11/2018  15.4 (30.8) in 04/2019  17.8 (35.6) in 11/2019  20.5 (41) in 04/2020  BPH WITH LUTS His IPSS score today is 7, which is mild lower urinary tract symptomatology. He is pleased with his quality life due to his urinary symptoms.  His previous I PSS score 0/0.  He continues to take finasteride 5 mg daily.   Patient denies any modifying or aggravating factors.  Patient denies any gross hematuria, dysuria or suprapubic/flank pain.  Patient denies any fevers, chills, nausea or vomiting.      IPSS    Row Name 05/31/20 1000         International Prostate Symptom Score   How often have you had the sensation of not emptying your bladder? Not at All     How often have you had to urinate less than every two hours? Not at All     How often have you found you stopped and started again several times when you urinated? Less than half the time     How often have you found it difficult to postpone urination? Less than 1 in 5 times     How often have you had a weak urinary stream? More than half the time     How often have you had to strain to start urination? Not at All     How many times did you typically get up at night to urinate? None     Total IPSS Score 7       Quality of Life due to urinary symptoms   If you were to spend the rest of your life with your urinary condition just the  way it is now how would you feel about that? Pleased            Score:  1-7 Mild 8-19 Moderate 20-35 Severe  Family history of prostate cancer He has a family history of PCa, with his father having lethal prostate cancer.     PMH: Past Medical History:  Diagnosis Date  . Arthritis   . BPH (benign prostatic hypertrophy)   . Elevated PSA   . HLD (hyperlipidemia)   . HTN (hypertension)     Surgical History: Past Surgical History:  Procedure Laterality Date  . HAND SURGERY Right    nodule removed    Home Medications:  Allergies as of 05/31/2020   No Known Allergies     Medication List       Accurate as of May 31, 2020  1:57 PM. If you have any questions, ask your nurse or doctor.        aspirin 325 MG tablet Take 325 mg by mouth daily.   aspirin EC 81 MG tablet Take 81 mg by mouth daily. Swallow whole.   clopidogrel 75 MG tablet Commonly known as: PLAVIX  Take 75 mg by mouth daily.   finasteride 5 MG tablet Commonly known as: Proscar Take 1 tablet (5 mg total) by mouth daily.   FISH OIL PO Take by mouth.   lisinopril 10 MG tablet Commonly known as: ZESTRIL   rosuvastatin 20 MG tablet Commonly known as: CRESTOR Take 20 mg by mouth daily.   Vitamin D (Ergocalciferol) 1.25 MG (50000 UNIT) Caps capsule Commonly known as: DRISDOL Take 50,000 Units by mouth every 7 (seven) days.       Allergies: No Known Allergies  Family History: Family History  Problem Relation Age of Onset  . Prostate cancer Father        (not sure)  . Bone cancer Father   . Kidney disease Neg Hx   . Kidney cancer Neg Hx   . Bladder Cancer Neg Hx     Social History:  reports that he has never smoked. He has never used smokeless tobacco. He reports that he does not drink alcohol and does not use drugs.  ROS: For pertinent review of systems please refer to history of present illness  Physical Exam: There were no vitals taken for this visit.  Constitutional:  Well  nourished. Alert and oriented, No acute distress. HEENT: [place] AT, mask in place.  Trachea midline Cardiovascular: No clubbing, cyanosis, or edema. Respiratory: Normal respiratory effort, no increased work of breathing. GU: No CVA tenderness.  No bladder fullness or masses.  Patient with circumcised phallus. Urethral meatus is patent.  No penile discharge. No penile lesions or rashes. Scrotum without lesions, cysts, rashes and/or edema.  Testicles are located scrotally bilaterally. No masses are appreciated in the testicles. Left and right epididymis are normal. Rectal: Not examined Lymph: No inguinal adenopathy. Neurologic: Grossly intact, no focal deficits, moving all 4 extremities. Psychiatric: Normal mood and affect.  Laboratory Data: See HPI I have reviewed the labs.  Assessment & Plan:    1. Prostate cancer Clinical dx based on exam and history of elevated PSA. Goal is to pursue palliative care and symptom management control rather than for curative intent at his age We had another discussion regarding the natural history of prostate cancer at length including development of metastatic disease including to the bone, pathologic fracture, renal failure from urinary obstruction amongst others. I suggested that he undergo imaging to evaluate for metastasis at this time, but he declined He will return in 6 months for PSA   2. BPH with LUTS IPSS score is 7/1, it is worsening  Continue conservative management, avoiding bladder irritants and timed voiding's Continue finasteride 5 mg daily  RTC in 6 months for IPSS, PSA and exam    3. Family history of prostate cancer  - father with lethal prostate cancer    Return in about 6 months (around 11/28/2020) for IPSS, PSA and exam.  Zara Council, [facility]  [place] [address] [place], [place] [address] [phone]

## 2020-05-31 ENCOUNTER — Other Ambulatory Visit: Payer: Self-pay

## 2020-05-31 ENCOUNTER — Encounter: Payer: Self-pay | Admitting: Urology

## 2020-05-31 ENCOUNTER — Ambulatory Visit: Payer: Medicare Other | Admitting: Urology

## 2020-05-31 DIAGNOSIS — N138 Other obstructive and reflux uropathy: Secondary | ICD-10-CM

## 2020-05-31 DIAGNOSIS — R972 Elevated prostate specific antigen [PSA]: Secondary | ICD-10-CM

## 2020-05-31 DIAGNOSIS — Z8042 Family history of malignant neoplasm of prostate: Secondary | ICD-10-CM | POA: Diagnosis not present

## 2020-05-31 DIAGNOSIS — N401 Enlarged prostate with lower urinary tract symptoms: Secondary | ICD-10-CM | POA: Diagnosis not present

## 2020-05-31 DIAGNOSIS — R3989 Other symptoms and signs involving the genitourinary system: Secondary | ICD-10-CM | POA: Diagnosis not present

## 2020-06-02 ENCOUNTER — Other Ambulatory Visit: Payer: Self-pay | Admitting: Urology

## 2020-06-02 DIAGNOSIS — C61 Malignant neoplasm of prostate: Secondary | ICD-10-CM

## 2020-06-02 NOTE — Progress Notes (Addendum)
I have placed orders for both the bone scan and ct scan for him as he now wants to go forward with the imaging.

## 2020-07-05 ENCOUNTER — Encounter
Admission: RE | Admit: 2020-07-05 | Discharge: 2020-07-05 | Disposition: A | Discharge status: Home / Self Care | Payer: Medicare Other | Source: Ambulatory Visit | Attending: Urology | Admitting: Urology

## 2020-07-05 ENCOUNTER — Other Ambulatory Visit: Payer: Self-pay

## 2020-07-05 ENCOUNTER — Ambulatory Visit
Admission: RE | Admit: 2020-07-05 | Discharge: 2020-07-05 | Disposition: A | Discharge status: Home / Self Care | Payer: Medicare Other | Source: Ambulatory Visit | Attending: Urology | Admitting: Urology

## 2020-07-05 DIAGNOSIS — C61 Malignant neoplasm of prostate: Secondary | ICD-10-CM | POA: Insufficient documentation

## 2020-07-05 LAB — POCT I-STAT CREATININE: Creatinine, Ser: 1 mg/dL (ref 0.61–1.24)

## 2020-07-05 MED ORDER — IOHEXOL 300 MG/ML  SOLN
100.0000 mL | Freq: Once | INTRAMUSCULAR | Status: AC | PRN
  Administered 2020-07-05: 85 mL via INTRAVENOUS

## 2020-07-05 MED ORDER — TECHNETIUM TC 99M MEDRONATE IV KIT
20.0000 | PACK | Freq: Once | INTRAVENOUS | Status: AC | PRN
  Administered 2020-07-05: 23.845 via INTRAVENOUS

## 2020-07-07 ENCOUNTER — Telehealth: Payer: Self-pay | Admitting: Family Medicine

## 2020-07-07 NOTE — Telephone Encounter (Signed)
Spoke to patient.    Made follow up appointments in January for labs and office visit.

## 2020-07-07 NOTE — Telephone Encounter (Signed)
Left message for patient to return call.

## 2020-07-07 NOTE — Telephone Encounter (Signed)
-----   Message from Nori Riis, PA-C sent at 07/07/2020  3:22 PM EDT ----- Would you let Mr. Herms know that neither scans demonstrated any sign of prostate cancer in his. Ones or outside his prostate.  I would like to see him in three months with PSA prior.

## 2020-09-29 ENCOUNTER — Other Ambulatory Visit: Payer: Self-pay

## 2020-10-02 ENCOUNTER — Other Ambulatory Visit: Payer: Medicare Other

## 2020-10-02 ENCOUNTER — Other Ambulatory Visit: Payer: Self-pay

## 2020-10-02 DIAGNOSIS — R972 Elevated prostate specific antigen [PSA]: Secondary | ICD-10-CM

## 2020-10-03 LAB — PSA: Prostate Specific Ag, Serum: 25.7 ng/mL — ABNORMAL HIGH (ref 0.0–4.0)

## 2020-10-03 NOTE — Progress Notes (Signed)
6:53 PM   Aaron Grimes May 14, 1930 [id number]  Referring provider: Lenard Simmer, MD [address] [place],  [place] [address]  Chief Complaint  Patient presents with  . Prostate Cancer  . Benign Prostatic Hypertrophy   Urological history 1. Elevated PSA - PSA Trend  6.8 in 07/2015  6.4 in 08/2015  8.2 in 01/2016 - started finasteride  4.6 (11.2) in 05/2016  7.1 (14.2) in 02/2017 - 35% probability - patient stopped finasteride/restarted  6.4  (12.8) in 09/2017  7.4 (14.8) in 02/2018   8.1 (16.2) in 05/2018  10.4 (20.8) in 11/2018  15.4 (30.8) in 04/2019  17.8 (35.6) in 11/2019  20.5 (41) in 05/2020  25.7 (51.4) in 09/2020 - declined biopsy due to advanced age and co morbidities - bone scan 06/2020 negative - contrast CT 06/2020 negative  - father with lethal prostate cancer at advanced age   89. BPH with LU TS - I PSS 5/0 - on finasteride 5 mg daily    HPI: Aaron Grimes is an 87 year old male who presents today for a 6 months follow up.   His only complaint at this time is a weak urinary stream.  Patient denies any modifying or aggravating factors.  Patient denies any gross hematuria, dysuria or suprapubic/flank pain.  Patient denies any fevers, chills, nausea or vomiting.       IPSS    Row Name 10/04/20 1100         International Prostate Symptom Score   How often have you had the sensation of not emptying your bladder? Not at All     How often have you had to urinate less than every two hours? Not at All     How often have you found you stopped and started again several times when you urinated? Not at All     How often have you found it difficult to postpone urination? Less than 1 in 5 times     How often have you had a weak urinary stream? More than half the time     How often have you had to strain to start urination? Not at All     How many times did you typically get up at night to urinate? None     Total IPSS Score 5           Quality  of Life due to urinary symptoms   If you were to spend the rest of your life with your urinary condition just the way it is now how would you feel about that? Delighted            Score:  1-7 Mild 8-19 Moderate 20-35 Severe   PMH: Past Medical History:  Diagnosis Date  . Arthritis   . BPH (benign prostatic hypertrophy)   . Elevated PSA   . HLD (hyperlipidemia)   . HTN (hypertension)     Surgical History: Past Surgical History:  Procedure Laterality Date  . HAND SURGERY Right    nodule removed    Home Medications:  Allergies as of 10/04/2020   No Known Allergies     Medication List       Accurate as of October 04, 2020 11:59 PM. If you have any questions, ask your nurse or doctor.        STOP taking these medications   FISH OIL PO Stopped by: Zara Council, PA-C     TAKE these medications   aspirin EC 81 MG tablet  Take 81 mg by mouth daily. Swallow whole. What changed: Another medication with the same name was removed. Continue taking this medication, and follow the directions you see here. Changed by: Zara Council, PA-C   clopidogrel 75 MG tablet Commonly known as: PLAVIX Take 75 mg by mouth daily.   cyanocobalamin 1000 MCG/ML injection Commonly known as: (VITAMIN B-12) INJECT 1ML INTRAMUSCULARLY ONCE A WEEK FOR 4 WEEKS, THEN ONCE A MONTH FOR 4 MONTHS AS DIRECTED   ferrous sulfate 325 (65 FE) MG tablet Take by mouth.   finasteride 5 MG tablet Commonly known as: Proscar Take 1 tablet (5 mg total) by mouth daily.   gabapentin 600 MG tablet Commonly known as: NEURONTIN Take by mouth.   lisinopril 10 MG tablet Commonly known as: ZESTRIL   rosuvastatin 20 MG tablet Commonly known as: CRESTOR Take 20 mg by mouth daily.   Vitamin D (Ergocalciferol) 1.25 MG (50000 UNIT) Caps capsule Commonly known as: DRISDOL Take 50,000 Units by mouth every 7 (seven) days.       Allergies: No Known Allergies  Family History: Family History   Problem Relation Age of Onset  . Prostate cancer Father        (not sure)  . Bone cancer Father   . Kidney disease Neg Hx   . Kidney cancer Neg Hx   . Bladder Cancer Neg Hx     Social History:  reports that he has never smoked. He has never used smokeless tobacco. He reports that he does not drink alcohol and does not use drugs.  ROS: For pertinent review of systems please refer to history of present illness  Physical Exam: BP (!) 170/81   Pulse (!) 58   Ht 5\' 8"  (1.727 m)   Wt 151 lb 12.8 oz (68.9 kg)   BMI 23.08 kg/m   Constitutional:  Well nourished. Alert and oriented, No acute distress. HEENT: [place] AT, mask in place.  Trachea midline Cardiovascular: No clubbing, cyanosis, or edema. Respiratory: Normal respiratory effort, no increased work of breathing. Neurologic: Grossly intact, no focal deficits, moving all 4 extremities. Psychiatric: Normal mood and affect.  Laboratory Data: See HPI I have reviewed the labs.  Assessment & Plan:    1. Prostate cancer Clinical diagnosis based on exam and history of elevated PSA Patient is not willing to undergo prostate biopsy at this time His desire at this time is to continue to follow his PSA and symptoms every 6 months If the PSA should rise precipitously or if he should become symptomatic, he may consider prostate biopsy or repeat imaging  2. BPH with LUTS IPSS score is 5/0, it is improving  Continue conservative management, avoiding bladder irritants and timed voiding's Continue finasteride 5 mg daily    3. Family history of prostate cancer Father with lethal prostate cancer    Return in about 6 months (around 04/03/2021) for 72mo w/PSA prior.  Zara Council, PA-C  [facility] [address] [place], [place] [address] [phone]

## 2020-10-04 ENCOUNTER — Other Ambulatory Visit: Payer: Self-pay

## 2020-10-04 ENCOUNTER — Encounter: Payer: Self-pay | Admitting: Urology

## 2020-10-04 ENCOUNTER — Ambulatory Visit: Payer: Medicare Other | Admitting: Urology

## 2020-10-04 VITALS — BP 170/81 | HR 58 | Ht 68.0 in | Wt 151.8 lb

## 2020-10-04 DIAGNOSIS — N138 Other obstructive and reflux uropathy: Secondary | ICD-10-CM

## 2020-10-04 DIAGNOSIS — C61 Malignant neoplasm of prostate: Secondary | ICD-10-CM

## 2020-10-04 DIAGNOSIS — N401 Enlarged prostate with lower urinary tract symptoms: Secondary | ICD-10-CM | POA: Diagnosis not present

## 2020-10-04 DIAGNOSIS — Z8042 Family history of malignant neoplasm of prostate: Secondary | ICD-10-CM

## 2020-10-04 MED ORDER — FINASTERIDE 5 MG PO TABS: 5.0000 mg | ORAL_TABLET | Freq: Every day | ORAL | 3 refills | Status: DC

## 2020-11-07 IMAGING — CT CT ABD-PELV W/ CM
2 of 5 series · 15 of 46 positions shown, 17 images · IV contrast (APPLIED)
Comparison: Lumbar MRI from 06/04/2013

CLINICAL DATA: Increasing PSA level with nodular prostate gland

EXAM:
CT ABDOMEN AND PELVIS WITH CONTRAST
TECHNIQUE: Multidetector CT imaging of the abdomen and pelvis was performed
using the standard protocol following bolus administration of
intravenous contrast.
CONTRAST:  85mL OMNIPAQUE IOHEXOL 300 MG/ML  SOLN

[Series 2: routine abd/pel with · axial · 0.70mm/px · z∈[-489,-74]mm · 12 of 93 slices shown, 14 images]
[im 5/93  soft-tissue]
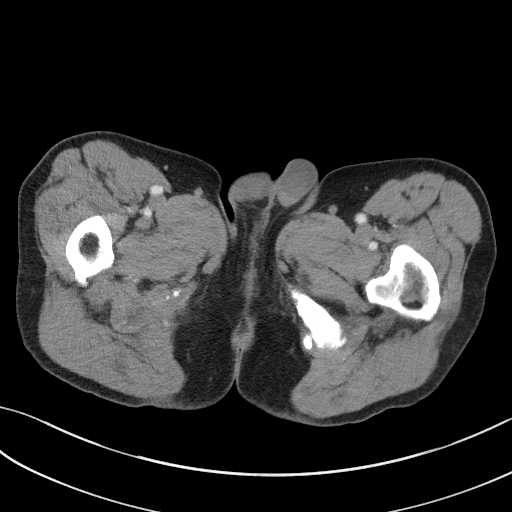
[im 5/93  bone]
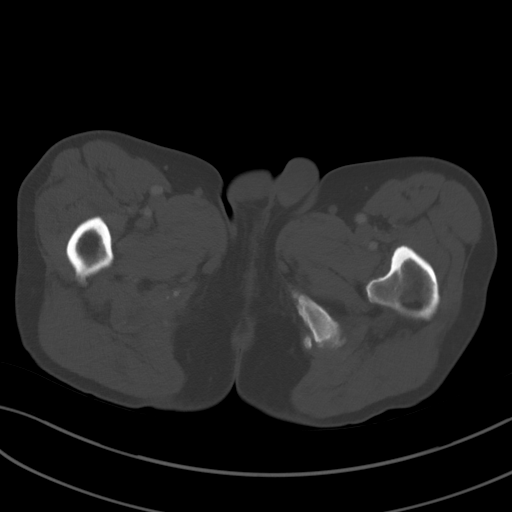
[im 15/93  soft-tissue]
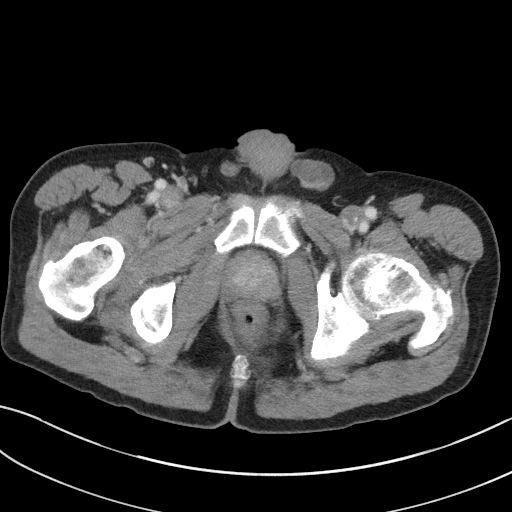
[im 20/93  soft-tissue]
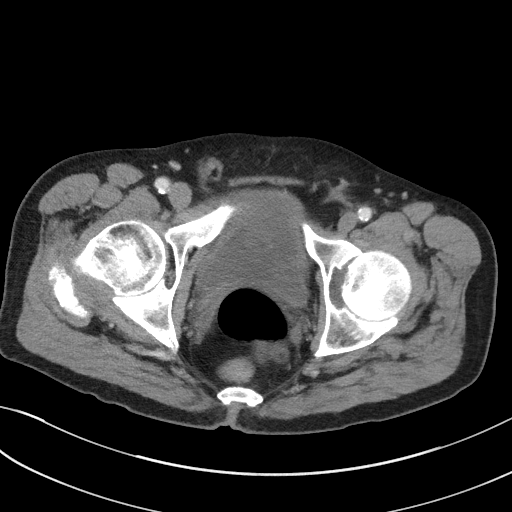
[im 30/93  soft-tissue]
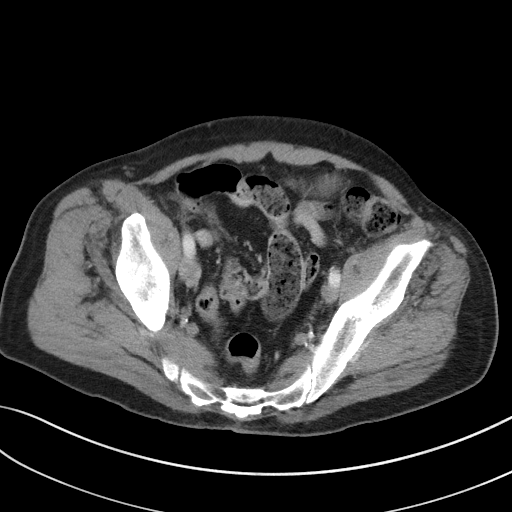
[im 34/93  soft-tissue]
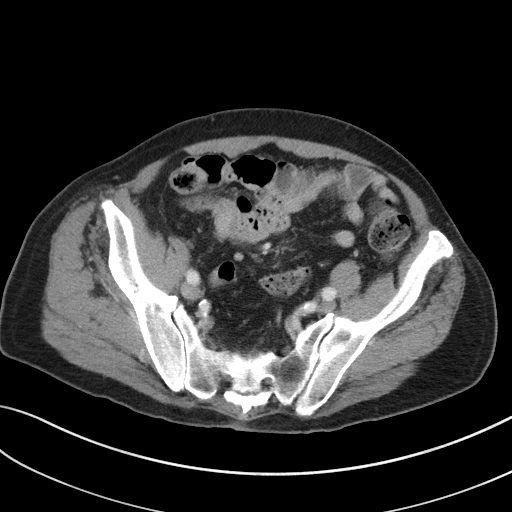
[im 44/93  soft-tissue]
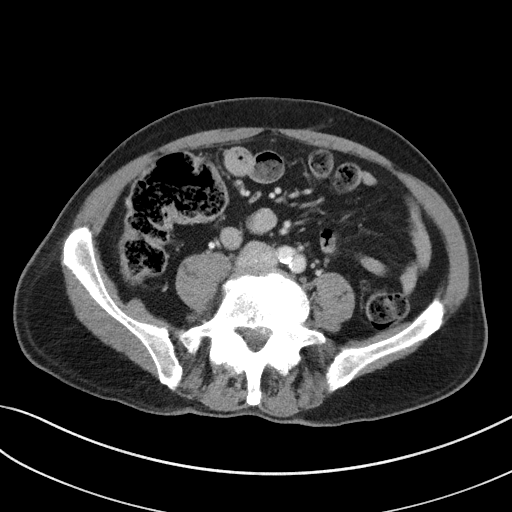
[im 49/93  soft-tissue]
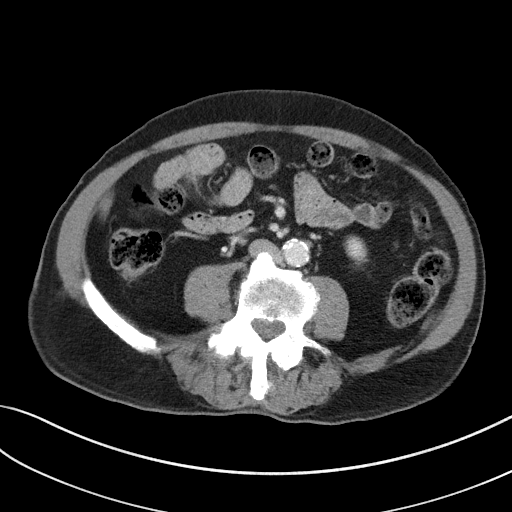
[im 59/93  soft-tissue]
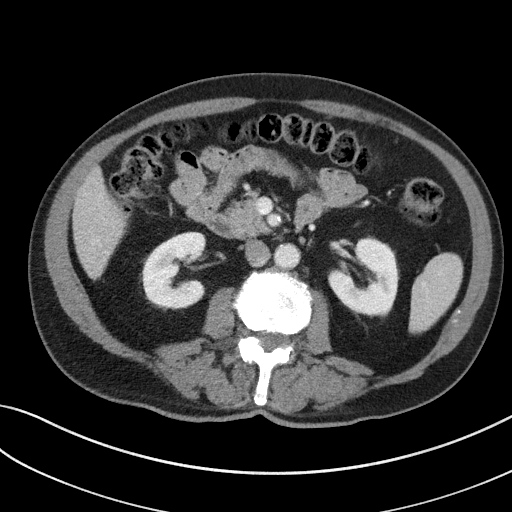
[im 63/93  soft-tissue]
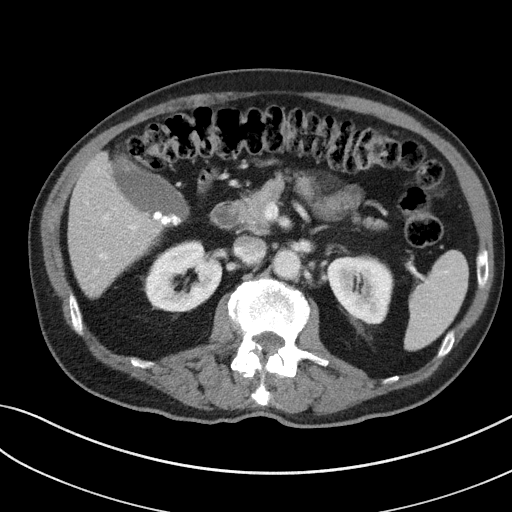
[im 63/93  bone]
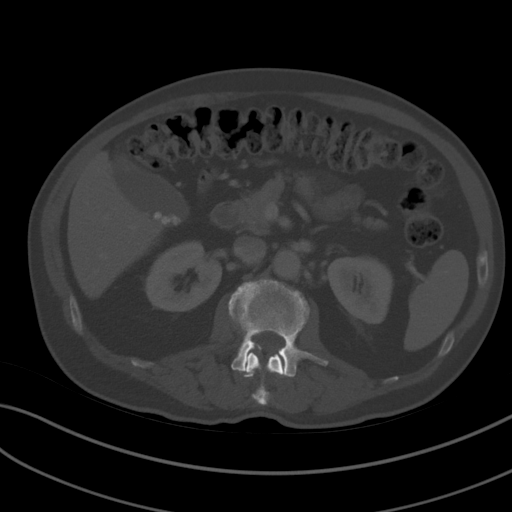
[im 73/93  soft-tissue]
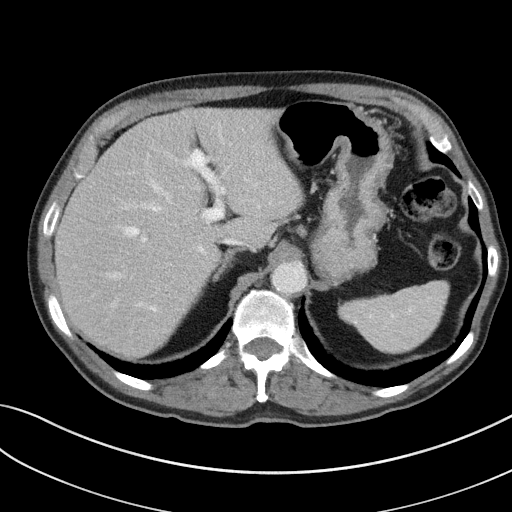
[im 78/93  soft-tissue]
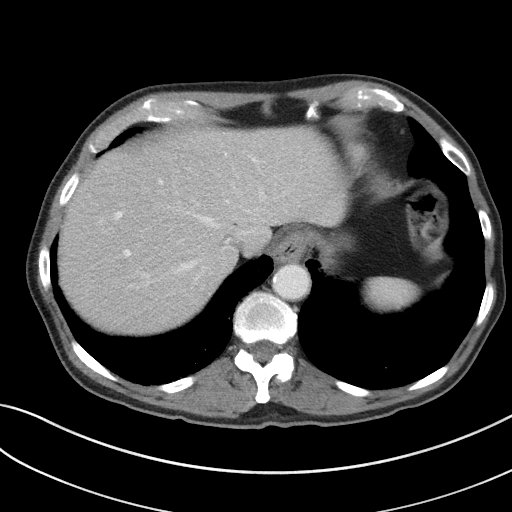
[im 88/93  soft-tissue]
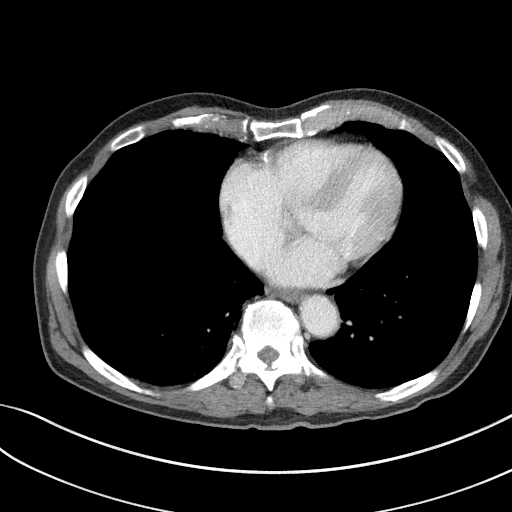

[Series 5: coronal st · coronal · 0.70mm/px · 3 of 88 slices shown]
[im 30/88  soft-tissue]
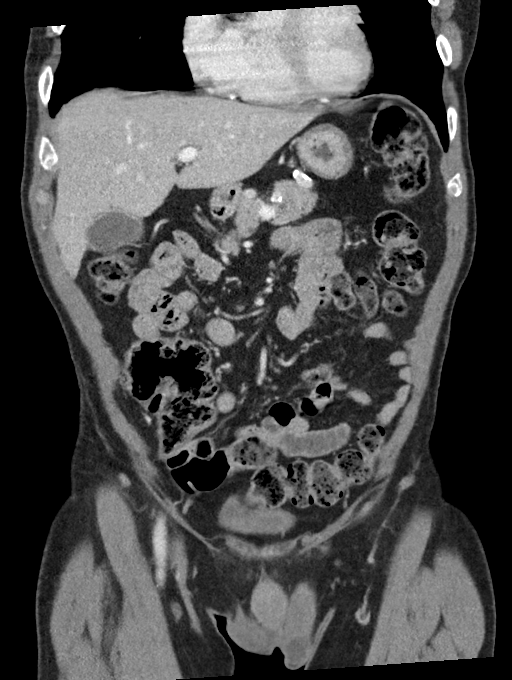
[im 39/88  soft-tissue]
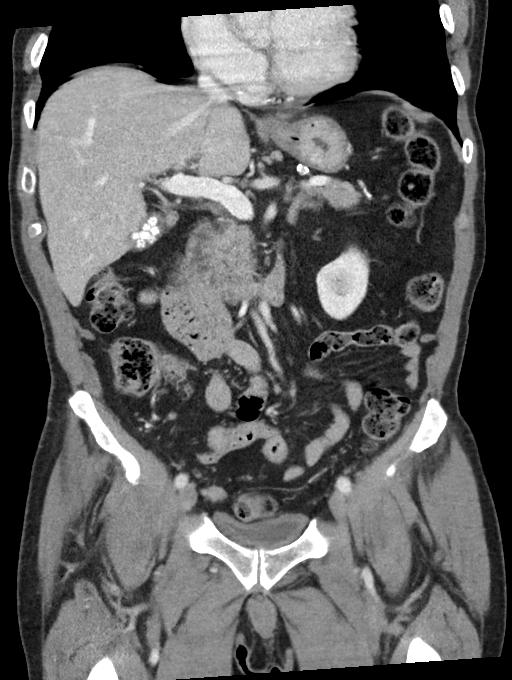
[im 49/88  soft-tissue]
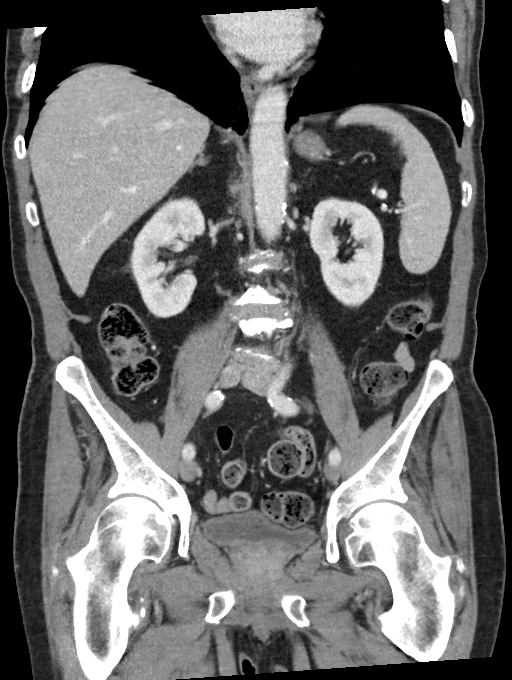

[15 of 46 positions shown; findings below may reference images not displayed]

FINDINGS: Lower chest: Mild subsegmental atelectasis in both lower lobes.
Descending thoracic aortic atherosclerotic calcification.

Hepatobiliary: Several layering gallstones measuring up to 0.7 cm in
long axis. Borderline gallbladder wall thickening. Small capsular
calcification along the posterior margin of the right hepatic lobe
on image 23 of series 2. The liver appears otherwise normal.

Pancreas: Unremarkable

Spleen: Unremarkable

Adrenals/Urinary Tract: Unremarkable

Stomach/Bowel: Prominent stool throughout the colon favors
constipation.

Vascular/Lymphatic: Aortoiliac atherosclerotic vascular disease.
Currently no pathologic adenopathy is identified.

Reproductive: The prostate gland measures 4.2 by 3.6 by 4.6 cm
(volume = 36 cm^3). Mildly asymmetric enhancement in the left
peripheral zone compared to the right (nonspecific). Partially
retracted left testicle along the spermatic cord, potentially with a
epididymal cyst or spermatocele on the left.

Other: No supplemental non-categorized findings.

Musculoskeletal: Levoconvex lumbar scoliosis with mild rotary
component. Lumbar spondylosis and degenerative disc disease causing
impingement at all levels between L1 and S1, but most strikingly at
L4-5 and L5-S1. Spurring of both acetabula with some chronic
fragmentation of the left anterior acetabulum. I not discern
compelling findings of osseous metastatic disease based on the CT
data.
IMPRESSION: 1. No findings of adenopathy or osseous metastatic disease.
2. Mildly asymmetric enhancement in the left peripheral zone
compared to the right (nonspecific).
3. Partially retracted left testicle along the spermatic cord,
potentially with a epididymal cyst or spermatocele on the left.
4. Other imaging findings of potential clinical significance:
Cholelithiasis. Prominent stool throughout the colon favors
constipation. Levoconvex lumbar scoliosis with mild rotary
component. Lumbar spondylosis and degenerative disc disease causing
impingement at all levels between L1 and S1, but most strikingly at
L4-5 and L5-S1.
5. Aortic atherosclerosis.

Aortic Atherosclerosis ([id number]).

## 2020-11-24 ENCOUNTER — Other Ambulatory Visit: Payer: Self-pay

## 2020-11-28 ENCOUNTER — Ambulatory Visit: Payer: Self-pay | Admitting: Urology

## 2021-03-30 ENCOUNTER — Other Ambulatory Visit: Payer: Self-pay

## 2021-03-30 ENCOUNTER — Other Ambulatory Visit: Payer: Medicare Other

## 2021-03-30 DIAGNOSIS — C61 Malignant neoplasm of prostate: Secondary | ICD-10-CM

## 2021-03-31 LAB — PSA: Prostate Specific Ag, Serum: 31.7 ng/mL — ABNORMAL HIGH (ref 0.0–4.0)

## 2021-04-02 NOTE — Progress Notes (Signed)
12:43 PM   Aaron Grimes 13-May-1930 [id number]  Referring provider: Lenard Simmer, MD [address] [place],  [place] [address]  Urological history: 1. Elevated PSA - PSA Trend  6.8 in 07/2015  6.4 in 08/2015  8.2 in 01/2016 - started finasteride  4.6 (11.2) in 05/2016  7.1 (14.2) in 02/2017 - 35% probability - patient stopped finasteride/restarted  6.4  (12.8) in 09/2017  7.4 (14.8) in 02/2018   8.1 (16.2) in 05/2018  10.4 (20.8) in 11/2018  15.4 (30.8) in 04/2019  17.8 (35.6) in 11/2019  20.5 (41) in 05/2020  25.7 (51.4) in 09/2020  31.7 (63.4) in 03/2021 - declined biopsy due to advanced age and co morbidities - bone scan 06/2020 negative - contrast CT 06/2020 negative   2. Family history of prostate cancer - father with lethal prostate cancer at advanced age   35. BPH with LU TS - I PSS 5/0 - on finasteride 5 mg daily   Chief Complaint  Patient presents with   Prostate Cancer    HPI: Aaron Grimes is a 87 y.o. male who presents today for a 6 month follow up.   He has no urinary complaints at this time.  Patient denies any modifying or aggravating factors.  Patient denies any gross hematuria, dysuria or suprapubic/flank pain.  Patient denies any fevers, chills, nausea or vomiting.        IPSS     Row Name 04/03/21 1100         International Prostate Symptom Score   How often have you had the sensation of not emptying your bladder? Not at All     How often have you had to urinate less than every two hours? Not at All     How often have you found you stopped and started again several times when you urinated? Less than half the time     How often have you found it difficult to postpone urination? Less than 1 in 5 times     How often have you had a weak urinary stream? More than half the time     How often have you had to strain to start urination? Not at All     How many times did you typically get up at night to urinate? None      Total IPSS Score 7           Quality of Life due to urinary symptoms     If you were to spend the rest of your life with your urinary condition just the way it is now how would you feel about that? Delighted              Score:  1-7 Mild 8-19 Moderate 20-35 Severe   PMH: Past Medical History:  Diagnosis Date   Arthritis    BPH (benign prostatic hypertrophy)    Elevated PSA    HLD (hyperlipidemia)    HTN (hypertension)     Surgical History: Past Surgical History:  Procedure Laterality Date   HAND SURGERY Right    nodule removed    Home Medications:  Allergies as of 04/03/2021   No Known Allergies      Medication List        Accurate as of April 03, 2021 12:43 PM. If you have any questions, ask your nurse or doctor.          aspirin EC 81 MG tablet Take 81 mg by mouth daily. Swallow  whole.   clopidogrel 75 MG tablet Commonly known as: PLAVIX Take 75 mg by mouth daily.   cyanocobalamin 100 MCG tablet Take 100 mcg by mouth daily. What changed: Another medication with the same name was removed. Continue taking this medication, and follow the directions you see here. Changed by: Zara Council, PA-C   ferrous sulfate 325 (65 FE) MG tablet Take by mouth.   finasteride 5 MG tablet Commonly known as: Proscar Take 1 tablet (5 mg total) by mouth daily.   gabapentin 600 MG tablet Commonly known as: NEURONTIN Take by mouth.   lisinopril 10 MG tablet Commonly known as: ZESTRIL   rosuvastatin 20 MG tablet Commonly known as: CRESTOR Take 20 mg by mouth daily.   Vitamin D (Ergocalciferol) 1.25 MG (50000 UNIT) Caps capsule Commonly known as: DRISDOL Take 50,000 Units by mouth every 7 (seven) days.        Allergies: No Known Allergies  Family History: Family History  Problem Relation Age of Onset   Prostate cancer Father        (not sure)   Bone cancer Father    Kidney disease Neg Hx    Kidney cancer Neg Hx    Bladder Cancer Neg Hx      Social History:  reports that he has never smoked. He has never used smokeless tobacco. He reports that he does not drink alcohol and does not use drugs.  ROS: For pertinent review of systems please refer to history of present illness  Physical Exam: BP 116/64   Pulse (!) 55   Ht 5\' 8"  (1.727 m)   Wt 145 lb (65.8 kg)   BMI 22.05 kg/m   Constitutional:  Well nourished. Alert and oriented, No acute distress. HEENT: [place] AT, mask in place.  Trachea midline Cardiovascular: No clubbing, cyanosis, or edema. Respiratory: Normal respiratory effort, no increased work of breathing. Neurologic: Grossly intact, no focal deficits, moving all 4 extremities. Psychiatric: Normal mood and affect.   Laboratory Data: Component     Latest Ref Rng & Units 05/24/2016 03/17/2017 03/17/2017 09/29/2017          9:11 AM  9:11 AM   Prostate Specific Ag, Serum     0.0 - 4.0 ng/mL 4.6 (H) 7.1 (H) 7.2 (H) 6.4 (H)   Component     Latest Ref Rng & Units 03/13/2018 06/12/2018 12/10/2018 05/17/2019             Prostate Specific Ag, Serum     0.0 - 4.0 ng/mL 7.4 (H) 8.1 (H) 10.4 (H) 15.4 (H)   Component     Latest Ref Rng & Units 11/22/2019 05/26/2020 10/02/2020 03/30/2021             Prostate Specific Ag, Serum     0.0 - 4.0 ng/mL 17.8 (H) 20.5 (H) 25.7 (H) 31.7 (H)   I have reviewed the labs.  Assessment & Plan:    1. Prostate cancer -Clinical diagnosis based on exam and history of elevated PSA -Patient is still not willing to undergo prostate biopsy at this time -Discussed the natural course of prostate cancer, where it tends to metastasize and explained that since we did not have a tissue diagnosis, we could not make clinical decisions regarding his prostate cancer progression -I have offered a palliative care referral, but he deferred -His desire at this time is to continue to follow his PSA and symptoms every 6 months -he is adamant about not having any treatment for the prostate cancer  at this time unless  he becomes symptomatic   2. BPH with LUTS -symptoms stable  3. Family history of prostate cancer Father with lethal prostate cancer    Return in about 6 months (around 10/04/2021) for 6 months with psa prior.  Zara Council, PA-C  [facility] [address] [place], [place] [address] [phone]

## 2021-04-03 ENCOUNTER — Encounter: Payer: Self-pay | Admitting: Urology

## 2021-04-03 ENCOUNTER — Other Ambulatory Visit: Payer: Self-pay

## 2021-04-03 ENCOUNTER — Ambulatory Visit: Payer: Medicare Other | Admitting: Urology

## 2021-04-03 VITALS — BP 116/64 | HR 55 | Ht 68.0 in | Wt 145.0 lb

## 2021-04-03 DIAGNOSIS — C61 Malignant neoplasm of prostate: Secondary | ICD-10-CM | POA: Diagnosis not present

## 2021-04-03 DIAGNOSIS — Z8042 Family history of malignant neoplasm of prostate: Secondary | ICD-10-CM

## 2021-04-03 DIAGNOSIS — N401 Enlarged prostate with lower urinary tract symptoms: Secondary | ICD-10-CM

## 2021-04-03 DIAGNOSIS — N138 Other obstructive and reflux uropathy: Secondary | ICD-10-CM | POA: Diagnosis not present

## 2021-09-28 ENCOUNTER — Other Ambulatory Visit: Payer: Self-pay

## 2021-09-28 ENCOUNTER — Other Ambulatory Visit: Payer: Medicare Other

## 2021-09-28 DIAGNOSIS — C61 Malignant neoplasm of prostate: Secondary | ICD-10-CM

## 2021-09-29 LAB — PSA: Prostate Specific Ag, Serum: 43.3 ng/mL — ABNORMAL HIGH (ref 0.0–4.0)

## 2021-10-01 NOTE — Progress Notes (Signed)
12:06 PM   Aaron Grimes March 15, 1930 [id number]  Referring provider: Lenard Simmer, MD [address] [place],  [place] [address]  Chief Complaint  Patient presents with   Benign Prostatic Hypertrophy   Prostate Cancer    Urological history: 1. Elevated PSA - PSA Trend  6.8 in 07/2015  6.4 in 08/2015  8.2 in 01/2016 - started finasteride  4.6 (11.2) in 05/2016  7.1 (14.2) in 02/2017 - 35% probability - patient stopped finasteride/restarted  6.4  (12.8) in 09/2017  7.4 (14.8) in 02/2018   8.1 (16.2) in 05/2018  10.4 (20.8) in 11/2018  15.4 (30.8) in 04/2019  17.8 (35.6) in 11/2019  20.5 (41) in 05/2020  25.7 (51.4) in 09/2020  31.7 (63.4) in 03/2021 - declined biopsy due to advanced age and co morbidities - bone scan 06/2020 negative - contrast CT 06/2020 negative   43.3 (86.6) in 09/2021  2. Family history of prostate cancer - father with lethal prostate cancer at advanced age   38. BPH with LU TS - I PSS 6/0 - on finasteride 5 mg daily    HPI: Aaron Grimes is a 88 y.o. male who presents today for a 6 month follow up.   His PSA continues to increase while finasteride.    He has no urinary complaints at this time.  He also denies any issues with bone pain, loss of appetite or malaise.  He states he feels pretty good.  Patient denies any modifying or aggravating factors.  Patient denies any gross hematuria, dysuria or suprapubic/flank pain.  Patient denies any fevers, chills, nausea or vomiting.       IPSS     Row Name 10/02/21 1100         International Prostate Symptom Score   How often have you had the sensation of not emptying your bladder? Not at All     How often have you had to urinate less than every two hours? Not at All     How often have you found you stopped and started again several times when you urinated? Less than 1 in 5 times     How often have you found it difficult to postpone urination? Less than 1 in 5 times      How often have you had a weak urinary stream? More than half the time     How often have you had to strain to start urination? Not at All     How many times did you typically get up at night to urinate? None     Total IPSS Score 6       Quality of Life due to urinary symptoms   If you were to spend the rest of your life with your urinary condition just the way it is now how would you feel about that? Delighted                Score:  1-7 Mild 8-19 Moderate 20-35 Severe   PMH: Past Medical History:  Diagnosis Date   Arthritis    BPH (benign prostatic hypertrophy)    Elevated PSA    HLD (hyperlipidemia)    HTN (hypertension)     Surgical History: Past Surgical History:  Procedure Laterality Date   HAND SURGERY Right    nodule removed    Home Medications:  Allergies as of 10/02/2021   No Known Allergies      Medication List        Accurate  as of October 02, 2021 12:06 PM. If you have any questions, ask your nurse or doctor.          aspirin EC 81 MG tablet Take 81 mg by mouth daily. Swallow whole.   clopidogrel 75 MG tablet Commonly known as: PLAVIX Take 75 mg by mouth daily.   cyanocobalamin 100 MCG tablet Take 100 mcg by mouth daily.   ferrous sulfate 325 (65 FE) MG tablet Take by mouth.   finasteride 5 MG tablet Commonly known as: Proscar Take 1 tablet (5 mg total) by mouth daily.   gabapentin 600 MG tablet Commonly known as: NEURONTIN Take by mouth.   lisinopril 10 MG tablet Commonly known as: ZESTRIL   rosuvastatin 20 MG tablet Commonly known as: CRESTOR Take 20 mg by mouth daily.   Vitamin D (Ergocalciferol) 1.25 MG (50000 UNIT) Caps capsule Commonly known as: DRISDOL Take 50,000 Units by mouth every 7 (seven) days.        Allergies: No Known Allergies  Family History: Family History  Problem Relation Age of Onset   Prostate cancer Father        (not sure)   Bone cancer Father    Kidney disease Neg Hx    Kidney  cancer Neg Hx    Bladder Cancer Neg Hx     Social History:  reports that he has never smoked. He has never used smokeless tobacco. He reports that he does not drink alcohol and does not use drugs.  ROS: For pertinent review of systems please refer to history of present illness  Physical Exam: BP (!) 152/55    Pulse (!) 47    Ht 5\' 8"  (1.727 m)    Wt 145 lb (65.8 kg)    BMI 22.05 kg/m   Constitutional:  Well nourished. Alert and oriented, No acute distress. HEENT: [place] AT, mask in place.  Trachea midline Cardiovascular: No clubbing, cyanosis, or edema. Respiratory: Normal respiratory effort, no increased work of breathing. Neurologic: Grossly intact, no focal deficits, moving all 4 extremities. Psychiatric: Normal mood and affect.   Laboratory Data: Component     Latest Ref Rng & Units 03/17/2017 03/17/2017 09/29/2017 03/13/2018         9:11 AM  9:11 AM    Prostate Specific Ag, Serum     0.0 - 4.0 ng/mL 7.1 (H) 7.2 (H) 6.4 (H) 7.4 (H)   Component     Latest Ref Rng & Units 06/12/2018 12/10/2018 05/17/2019 11/22/2019             Prostate Specific Ag, Serum     0.0 - 4.0 ng/mL 8.1 (H) 10.4 (H) 15.4 (H) 17.8 (H)   Component     Latest Ref Rng & Units 05/26/2020 10/02/2020 03/30/2021 09/28/2021             Prostate Specific Ag, Serum     0.0 - 4.0 ng/mL 20.5 (H) 25.7 (H) 31.7 (H) 43.3 (H)  I have reviewed the labs.  Assessment & Plan:    1. Prostate cancer -Clinical diagnosis based on exam and elevated PSA -He is not willing to undergo a prostate biopsy at this time -Reviewed the natural course of prostate cancer, where it tends to metastasize and explained that since we do not have a tissue diagnosis, we could not make clinical decisions regarding his prostate cancer aggressiveness -Offered repeat imaging at this time, but he deferred -Offered a palliative care referral, but he deferred -He desires to continue at this time monitoring  the PSA and his symptoms every 6 months -He is very  adamant about not having any treatment for the prostate cancer at this time unless he should become symptomatic in the future  2. BPH with LUTS -symptoms stable  3. Family history of prostate cancer Father with lethal prostate cancer    Return in about 6 months (around 04/01/2022) for PSA and IPSS .  Zara Council, PA-C  [facility] [address] [place], [place] [address] [phone]

## 2021-10-02 ENCOUNTER — Ambulatory Visit: Payer: Medicare Other | Admitting: Urology

## 2021-10-02 ENCOUNTER — Other Ambulatory Visit: Payer: Self-pay

## 2021-10-02 ENCOUNTER — Encounter: Payer: Self-pay | Admitting: Urology

## 2021-10-02 VITALS — BP 152/55 | HR 47 | Ht 68.0 in | Wt 145.0 lb

## 2021-10-02 DIAGNOSIS — N401 Enlarged prostate with lower urinary tract symptoms: Secondary | ICD-10-CM | POA: Diagnosis not present

## 2021-10-02 DIAGNOSIS — C61 Malignant neoplasm of prostate: Secondary | ICD-10-CM | POA: Diagnosis not present

## 2021-10-02 DIAGNOSIS — N138 Other obstructive and reflux uropathy: Secondary | ICD-10-CM | POA: Diagnosis not present

## 2021-10-02 DIAGNOSIS — Z8042 Family history of malignant neoplasm of prostate: Secondary | ICD-10-CM | POA: Diagnosis not present

## 2021-10-02 MED ORDER — FINASTERIDE 5 MG PO TABS: 5.0000 mg | ORAL_TABLET | Freq: Every day | ORAL | 3 refills | Status: AC

## 2022-04-08 NOTE — Progress Notes (Unsigned)
9:11 AM   Aaron Grimes [birth year] [id number]  Referring provider: [place] Simmer, MD [address] [place],  [place] [address]  Urological history: 1. Elevated PSA - PSA Trend  6.8 in 07/2015  6.4 in 08/2015  8.2 in 01/2016 - started finasteride  4.6 (11.2) in 05/2016  7.1 (14.2) in 02/2017 - 35% probability - patient stopped finasteride/restarted  6.4  (12.8) in 09/2017  7.4 (14.8) in 02/2018   8.1 (16.2) in 05/2018  10.4 (20.8) in 11/2018  15.4 (30.8) in 04/2019  17.8 (35.6) in 11/2019  20.5 (41) in 05/2020  25.7 (51.4) in 09/2020  31.7 (63.4) in 03/2021 - declined biopsy due to advanced age and co morbidities - bone scan 06/2020 negative - contrast CT 06/2020 negative   43.3 (86.6) in 09/2021  Today's PSA pending   2. Family history of prostate cancer - father with lethal prostate cancer at advanced age   3. BPH with LU TS - I PSS *** - on finasteride 5 mg daily    HPI: Aaron Grimes is a 88 y.o. male who presents today for a 6 month follow up.            Score:  1-7 Mild 8-19 Moderate 20-35 Severe   PMH: Past Medical History:  Diagnosis Date   Arthritis    BPH (benign prostatic hypertrophy)    Elevated PSA    HLD (hyperlipidemia)    HTN (hypertension)     Surgical History: Past Surgical History:  Procedure Laterality Date   HAND SURGERY Right    nodule removed    Home Medications:  Allergies as of 04/09/2022   No Known Allergies      Medication List        Accurate as of April 08, 2022  9:11 AM. If you have any questions, ask your nurse or doctor.          aspirin EC 81 MG tablet Take 81 mg by mouth daily. Swallow whole.   clopidogrel 75 MG tablet Commonly known as: PLAVIX Take 75 mg by mouth daily.   cyanocobalamin 100 MCG tablet Take 100 mcg by mouth daily.   ferrous sulfate 325 (65 FE) MG tablet Take by mouth.   finasteride 5 MG tablet Commonly known as: Proscar Take 1 tablet (5 mg  total) by mouth daily.   gabapentin 600 MG tablet Commonly known as: NEURONTIN Take by mouth.   lisinopril 10 MG tablet Commonly known as: ZESTRIL   rosuvastatin 20 MG tablet Commonly known as: CRESTOR Take 20 mg by mouth daily.   Vitamin D (Ergocalciferol) 1.25 MG (50000 UNIT) Caps capsule Commonly known as: DRISDOL Take 50,000 Units by mouth every 7 (seven) days.        Allergies: No Known Allergies  Family History: Family History  Problem Relation Age of Onset   Prostate cancer Father        (not sure)   Bone cancer Father    Kidney disease Neg Hx    Kidney cancer Neg Hx    Bladder Cancer Neg Hx     Social History:  reports that he has never smoked. He has never used smokeless tobacco. He reports that he does not drink alcohol and does not use drugs.  ROS: For pertinent review of systems please refer to history of present illness  Physical Exam: There were no vitals taken for this visit.  Constitutional:  Well nourished. Alert and oriented, No acute distress. HEENT: [place] AT,  moist mucus membranes.  Trachea midline Cardiovascular: No clubbing, cyanosis, or edema. Respiratory: Normal respiratory effort, no increased work of breathing. GU: No CVA tenderness.  No bladder fullness or masses.  Patient with circumcised/uncircumcised phallus. ***Foreskin easily retracted***  Urethral meatus is patent.  No penile discharge. No penile lesions or rashes. Scrotum without lesions, cysts, rashes and/or edema.  Testicles are located scrotally bilaterally. No masses are appreciated in the testicles. Left and right epididymis are normal. Rectal: Patient with  normal sphincter tone. Anus and perineum without scarring or rashes. No rectal masses are appreciated. Prostate is approximately *** grams, *** nodules are appreciated. Seminal vesicles are normal. Neurologic: Grossly intact, no focal deficits, moving all 4 extremities. Psychiatric: Normal mood and affect.   Laboratory  Data: WBC (White Blood Cell Count) 4.1 - 10.2 10^3/uL 4.7   RBC (Red Blood Cell Count) 4.69 - 6.13 10^6/uL 3.19 Low    Hemoglobin 14.1 - 18.1 gm/dL 10.4 Low    Hematocrit 40.0 - 52.0 % 31.5 Low    MCV (Mean Corpuscular Volume) 80.0 - 100.0 fl 98.7   MCH (Mean Corpuscular Hemoglobin) 27.0 - 31.2 pg 32.6 High    MCHC (Mean Corpuscular Hemoglobin Concentration) 32.0 - 36.0 gm/dL 33.0   Platelet Count 150 - 450 10^3/uL 205   RDW-CV (Red Cell Distribution Width) 11.6 - 14.8 % 13.3   MPV (Mean Platelet Volume) 9.4 - 12.4 fl 10.1   Neutrophils 1.50 - 7.80 10^3/uL 2.10   Lymphocytes 1.00 - 3.60 10^3/uL 1.65   Monocytes 0.00 - 1.50 10^3/uL 0.51   Eosinophils 0.00 - 0.55 10^3/uL 0.35   Basophils 0.00 - 0.09 10^3/uL 0.04   Neutrophil % 32.0 - 70.0 % 45.1   Lymphocyte % 10.0 - 50.0 % 35.4   Monocyte % 4.0 - 13.0 % 10.9   Eosinophil % 1.0 - 5.0 % 7.5 High    Basophil% 0.0 - 2.0 % 0.9   Immature Granulocyte % <=0.7 % 0.2   Immature Granulocyte Count <=0.06 10^3/L 0.01   Resulting Agency  [facility] - [facility]   Specimen Collected: 10/23/21 12:57   Performed by: [place]: 10/23/21 13:34  Received From: [facility]  Result Received: 04/08/22 09:11   Glucose 70 - 110 mg/dL 91   Sodium 136 - 145 mmol/L 138   Potassium 3.6 - 5.1 mmol/L 4.3   Chloride 97 - 109 mmol/L 105   Carbon Dioxide (CO2) 22.0 - 32.0 mmol/L 28.1   Urea Nitrogen (BUN) 7 - 25 mg/dL 16   Creatinine 0.7 - 1.3 mg/dL 0.8   Glomerular Filtration Rate (eGFR), MDRD Estimate >60 mL/min/1.73sq m 91   Calcium 8.7 - 10.3 mg/dL 9.2   AST  8 - 39 U/L 22   ALT  6 - 57 U/L 23   Alk Phos (alkaline Phosphatase) 34 - 104 U/L 47   Albumin 3.5 - 4.8 g/dL 3.9   Bilirubin, Total 0.3 - 1.2 mg/dL 0.5   Protein, Total 6.1 - 7.9 g/dL 6.1   A/G Ratio 1.0 - 5.0 gm/dL 1.8   Resulting Agency  [facility] - [facility]   Specimen Collected: 10/23/21 12:57   Performed by: [place]: 10/23/21 15:06  Received From: [facility]  Result Received: 04/08/22 09:11   Hemoglobin A1C 4.2 - 5.6 % 5.7 High    Average Blood Glucose (Calc) mg/dL 117   Resulting Agency  [facility] -  LAB  Narrative Performed by [facility] - [facility] Normal Range:    4.2 - 5.6%  Increased Risk:  5.7 - 6.4%  Diabetes:        >= 6.5%  Glycemic Control for adults with diabetes:  <7%    Specimen Collected: 10/23/21 12:57   Performed by: [place]: 10/23/21 14:41  Received From: [facility]  Result Received: 04/08/22 09:11   Thyroid Stimulating Hormone (TSH) 0.450-5.330 uIU/ml uIU/mL 0.951   Resulting Agency  [facility] - LAB   Specimen Collected: 10/23/21 12:57   Performed by: [place]: 10/23/21 16:03  Received From: [facility]  Result Received: 04/08/22 09:11  I have reviewed the labs.    Assessment & Plan:    1. Clinical prostate cancer -PSA pending  2. BPH with LUTS -symptoms stable  3. Family history of prostate cancer Father with lethal prostate cancer    No follow-ups on file.  Zara Council, PA-C  [facility] [address] [place], [place] [address] [phone]

## 2022-04-09 ENCOUNTER — Ambulatory Visit: Payer: Medicare Other | Admitting: Urology

## 2022-04-09 ENCOUNTER — Encounter: Payer: Self-pay | Admitting: Urology

## 2022-04-09 VITALS — BP 163/65 | HR 46 | Ht 68.0 in | Wt 147.0 lb

## 2022-04-09 DIAGNOSIS — N138 Other obstructive and reflux uropathy: Secondary | ICD-10-CM | POA: Diagnosis not present

## 2022-04-09 DIAGNOSIS — N401 Enlarged prostate with lower urinary tract symptoms: Secondary | ICD-10-CM

## 2022-04-09 DIAGNOSIS — Z8042 Family history of malignant neoplasm of prostate: Secondary | ICD-10-CM

## 2022-04-09 DIAGNOSIS — C61 Malignant neoplasm of prostate: Secondary | ICD-10-CM | POA: Diagnosis not present

## 2022-04-10 ENCOUNTER — Telehealth: Payer: Self-pay

## 2022-04-10 LAB — PSA: Prostate Specific Ag, Serum: 69.1 ng/mL — ABNORMAL HIGH (ref 0.0–4.0)

## 2022-04-10 NOTE — Telephone Encounter (Signed)
-----   Message from Nori Riis, PA-C sent at 04/10/2022  9:58 AM EDT ----- Please let Mr. Hillard Danker know that his PSA has increased again and I placed orders for the bone scan and the CT scan.

## 2022-04-10 NOTE — Telephone Encounter (Signed)
Pt notified as advised. He expressed understanding.

## 2022-04-23 ENCOUNTER — Encounter
Admission: RE | Admit: 2022-04-23 | Discharge: 2022-04-23 | Disposition: A | Discharge status: Home / Self Care | Payer: Medicare Other | Source: Ambulatory Visit | Attending: Urology | Admitting: Urology

## 2022-04-23 ENCOUNTER — Ambulatory Visit
Admission: RE | Admit: 2022-04-23 | Discharge: 2022-04-23 | Disposition: A | Discharge status: Home / Self Care | Payer: Medicare Other | Source: Ambulatory Visit | Attending: Urology | Admitting: Urology

## 2022-04-23 DIAGNOSIS — C61 Malignant neoplasm of prostate: Secondary | ICD-10-CM | POA: Insufficient documentation

## 2022-04-23 LAB — POCT I-STAT CREATININE: Creatinine, Ser: 1.5 mg/dL — ABNORMAL HIGH (ref 0.61–1.24)

## 2022-04-23 MED ORDER — IOHEXOL 300 MG/ML  SOLN
80.0000 mL | Freq: Once | INTRAMUSCULAR | Status: AC | PRN
  Administered 2022-04-23: 80 mL via INTRAVENOUS

## 2022-04-23 MED ORDER — TECHNETIUM TC 99M MEDRONATE IV KIT
20.0000 | PACK | Freq: Once | INTRAVENOUS | Status: AC | PRN
  Administered 2022-04-23: 20.75 via INTRAVENOUS

## 2022-04-25 ENCOUNTER — Ambulatory Visit: Payer: Medicare Other | Admitting: Urology

## 2022-04-25 ENCOUNTER — Encounter: Payer: Self-pay | Admitting: Urology

## 2022-04-25 DIAGNOSIS — N138 Other obstructive and reflux uropathy: Secondary | ICD-10-CM

## 2022-04-25 DIAGNOSIS — N401 Enlarged prostate with lower urinary tract symptoms: Secondary | ICD-10-CM

## 2022-04-25 DIAGNOSIS — C61 Malignant neoplasm of prostate: Secondary | ICD-10-CM

## 2022-04-25 LAB — BLADDER SCAN AMB NON-IMAGING

## 2022-04-25 NOTE — Progress Notes (Signed)
9:30 PM   Aaron Grimes Feb 19, 1930 [id number]  Referring provider: Lenard Simmer, MD [address] [place],  [place] [address]  Urological history: 1. Elevated PSA - PSA Trend  6.8 in 07/2015  6.4 in 08/2015  8.2 in 01/2016 - started finasteride  4.6 (11.2) in 05/2016  7.1 (14.2) in 02/2017 - 35% probability - patient stopped finasteride/restarted  6.4  (12.8) in 09/2017  7.4 (14.8) in 02/2018   8.1 (16.2) in 05/2018  10.4 (20.8) in 11/2018  15.4 (30.8) in 04/2019  17.8 (35.6) in 11/2019  20.5 (41) in 05/2020  25.7 (51.4) in 09/2020  31.7 (63.4) in 03/2021 - declined biopsy due to advanced age and co morbidities - bone scan 06/2020 negative - contrast CT 06/2020 negative   43.3 (86.6) in 09/2021  Today's PSA pending   2. Family history of prostate cancer - father with lethal prostate cancer at advanced age   31. BPH with LU TS - I PSS 7/1 - on finasteride 5 mg daily   HPI: Aaron Grimes is a 88 y.o. male who presents today for PVR and further discussion on CT and bone scan results.   NM Bone scan, 04/2022 - No definite evidence for skeletal metastatic disease 2. Multifocal degenerative uptake as described above, overall pattern similar compared to the previous exam.  Contrast CT, 04/2022 - Small to borderline enlarged abdominal and pelvic retroperitoneal lymph nodes, indicative of metastatic disease. Borderline enlarged inguinal lymph nodes, indeterminate.  Bladder was distended.     His main complaint and concern is his SOB and fatigue.  He denies any appetite loss, bone pain or urinary symptoms.  Patient denies any modifying or aggravating factors.  Patient denies any gross hematuria, dysuria or suprapubic/flank pain.  Patient denies any fevers, chills, nausea or vomiting.     PVR 2 mL   PMH: Past Medical History:  Diagnosis Date   Arthritis    BPH (benign prostatic hypertrophy)    Elevated PSA    HLD (hyperlipidemia)    HTN  (hypertension)     Surgical History: Past Surgical History:  Procedure Laterality Date   HAND SURGERY Right    nodule removed    Home Medications:  Allergies as of 04/25/2022   No Known Allergies      Medication List        Accurate as of April 25, 2022  9:30 PM. If you have any questions, ask your nurse or doctor.          Alpha-Lipoic Acid 600 MG Tabs   aspirin EC 81 MG tablet Take 81 mg by mouth daily. Swallow whole.   clopidogrel 75 MG tablet Commonly known as: PLAVIX Take 75 mg by mouth daily.   cyanocobalamin 100 MCG tablet Take 100 mcg by mouth daily.   ferrous sulfate 325 (65 FE) MG tablet Take by mouth.   finasteride 5 MG tablet Commonly known as: Proscar Take 1 tablet (5 mg total) by mouth daily.   furosemide 20 MG tablet Commonly known as: LASIX Take 20 mg by mouth 2 (two) times daily.   gabapentin 600 MG tablet Commonly known as: NEURONTIN Take by mouth.   lisinopril 10 MG tablet Commonly known as: ZESTRIL   rosuvastatin 20 MG tablet Commonly known as: CRESTOR Take 20 mg by mouth daily.   Vitamin D (Ergocalciferol) 1.25 MG (50000 UNIT) Caps capsule Commonly known as: DRISDOL Take 50,000 Units by mouth every 7 (seven) days.  Allergies: No Known Allergies  Family History: Family History  Problem Relation Age of Onset   Prostate cancer Father        (not sure)   Bone cancer Father    Kidney disease Neg Hx    Kidney cancer Neg Hx    Bladder Cancer Neg Hx     Social History:  reports that he has never smoked. He has never used smokeless tobacco. He reports that he does not drink alcohol and does not use drugs.  ROS: For pertinent review of systems please refer to history of present illness  Physical Exam: Constitutional:  Well nourished. Alert and oriented, No acute distress. HEENT: [place] AT, moist mucus membranes.  Trachea midline Cardiovascular: No clubbing, cyanosis, or edema. Respiratory: Normal respiratory effort,  no increased work of breathing. Neurologic: Grossly intact, no focal deficits, moving all 4 extremities. Psychiatric: Normal mood and affect.   Laboratory Data: N/A  Pertinent Imaging  CLINICAL DATA:  Prostate cancer   EXAM: NUCLEAR MEDICINE WHOLE BODY BONE SCAN   TECHNIQUE: Whole body anterior and posterior images were obtained approximately 3 hours after intravenous injection of radiopharmaceutical.   RADIOPHARMACEUTICALS:  20.75 mCi Technetium-53mMDP IV   COMPARISON:  CT 04/23/2022, bone scan 07/05/2020   FINDINGS: Physiologic activity in the kidneys and bladder. Similar distribution of suspected degenerative osseous activity involving the bilateral shoulders, wrists, and right foot. Less pronounced left sternoclavicular activity. No new activity is visualized.   IMPRESSION: 1. No definite evidence for skeletal metastatic disease 2. Multifocal degenerative uptake as described above, overall pattern similar compared to the previous exam.     Electronically Signed   By: KDonavan FoilM.D.   On: 04/23/2022 23:04   CLINICAL DATA:  Prostate cancer.  * Tracking Code: BO *   EXAM: CT ABDOMEN AND PELVIS WITH CONTRAST   TECHNIQUE: Multidetector CT imaging of the abdomen and pelvis was performed using the standard protocol following bolus administration of intravenous contrast.   RADIATION DOSE REDUCTION: This exam was performed according to the departmental dose-optimization program which includes automated exposure control, adjustment of the mA and/or kV according to patient size and/or use of iterative reconstruction technique.   CONTRAST:  859mOMNIPAQUE IOHEXOL 300 MG/ML  SOLN   COMPARISON:  Bone scan 07/05/2020, CT abdomen pelvis 07/05/2020.   FINDINGS: Lower chest: Moderate to large bilateral pleural effusions with bibasilar compressive atelectasis. Mild basilar septal thickening. Heart is enlarged. Small pericardial effusion. Distal esophagus  is unremarkable.   Hepatobiliary: Liver is decreased in attenuation diffusely in the margin is slightly irregular. Stones in the gallbladder. Gallbladder wall thickening. No biliary ductal dilatation.   Pancreas: Focal low attenuation in the uncinate process (2/35), possibly representing fat invagination. Otherwise unremarkable.   Spleen: Negative.   Adrenals/Urinary Tract: Adrenal glands and kidneys are unremarkable. Ureters are decompressed. Bladder is grossly unremarkable.   Stomach/Bowel: Stomach, small bowel, appendix and colon are unremarkable.   Vascular/Lymphatic: Atherosclerotic calcification of the aorta. Increased number and size of abdominal retroperitoneal lymph nodes. Gastrohepatic ligament lymph nodes are not enlarged by CT size criteria and appear similar. Deep right internal iliac lymph node measures 8 mm (2/67), new from 07/05/2020. Deep left internal iliac lymph node measures 10 mm (2/56), also new. Left external iliac lymph node measures 1.3 cm (2/57), new. Inguinal lymph nodes measure up to 9 mm on the left. Index left posterolateral periaortic lymph node measures 10 mm (2/35).   Reproductive: Prostate is visualized.   Other: Small bilateral inguinal  hernias contain fat. Small ascites. Mesenteries and peritoneum are otherwise unremarkable.   Musculoskeletal: Tiny bone island in the medial left iliac wing (2/52), as before. Degenerative changes in the spine. No worrisome lytic or sclerotic lesions.   IMPRESSION: 1. Small to borderline enlarged abdominal and pelvic retroperitoneal lymph nodes, indicative of metastatic disease. Borderline enlarged inguinal lymph nodes, indeterminate. 2. Congestive heart failure. 3. Small pericardial effusion. 4. Hepatic steatosis. Slight marginal irregularity raises suspicion for cirrhosis. 5. Cholelithiasis. Gallbladder wall thickening can be seen in the setting of ascites. Please correlate clinically for right  upper quadrant symptoms. 6. Small ascites. 7.  Aortic atherosclerosis (ICD10-I70.0).     Electronically Signed   By: Lorin Picket M.D.   On: 04/23/2022 14:47   Most Recent  Scan Result 86m 04/25/22 16:46    I have independently reviewed the films.  See HPI.      Assessment & Plan:    1. Clinical prostate cancer -CT scan with evidence of metastatic disease -NM bone scan negative for metastatic disease -discussed the results with Mr. WManeraand the NCCN guidelines for high-risk prostate cancer for individuals with < 5 years life expectancy and being asymptomatic (observation, ADT and/or EBRT)  -at this time, he would like to consider ADT but he would like to wait until he sees cardiology at the end of the month as ADT can contribute to worsening heart issues  -he is not wanting to undergo a prostate biopsy -he does not want a referral to the cancer center at this time to discuss other types of treatment -he does understand that this is palliative care and not for a cure   2. CHF -managed by cardiology-could not reach them to discuss CT lung findings -discussed CT findings with worsening pleural effusions with Dr. MRonnald Collumand he has increased Mr. WBefortLasix to 40 mg BID  3. Family history of prostate cancer Father with lethal prostate cancer    Return for waiting on [employer] approval .  SZara Council PA-C  [id number]SFairviewBHamlet [place] [phone] I spent 30 minutes on the day of the encounter to include pre-visit record review, face-to-face time with the patient, and post-visit ordering of tests.

## 2022-04-29 ENCOUNTER — Telehealth: Payer: Self-pay

## 2022-04-29 NOTE — Telephone Encounter (Signed)
-----   Message from Nori Riis, PA-C sent at 04/25/2022  5:47 PM EDT ----- Regarding: Mills Koller He would like to start ADT, but he has metastatic disease on CT scan, so we need to start with [facility].   He has not had a biopsy.  His PSA is 70 and his prostate is nodular, so it would be a clinical diagnosis.  He is not wanting a prostate biopsy.  Hopefully, they will approve it without pathology.

## 2022-04-29 NOTE — Telephone Encounter (Signed)
Prior authorization for Aaron Grimes started via [employer] portal. Awaiting response.

## 2022-05-09 NOTE — Telephone Encounter (Signed)
Incoming approval for [employer] loading dose.   Dates: 05/09/22 - 05/10/23  Auth #[id number]  Pt scheduled for 05/15/22 @ 10:30am

## 2022-05-15 ENCOUNTER — Ambulatory Visit (INDEPENDENT_AMBULATORY_CARE_PROVIDER_SITE_OTHER): Payer: Medicare Other | Admitting: Physician Assistant

## 2022-05-15 ENCOUNTER — Ambulatory Visit: Payer: Medicare Other | Admitting: Urology

## 2022-05-15 DIAGNOSIS — C61 Malignant neoplasm of prostate: Secondary | ICD-10-CM | POA: Diagnosis not present

## 2022-05-15 MED ORDER — DEGARELIX ACETATE(240 MG DOSE) 120 MG/VIAL ~~LOC~~ SOLR
240.0000 mg | Freq: Once | SUBCUTANEOUS | Status: AC
  Administered 2022-05-15: 240 mg via SUBCUTANEOUS

## 2022-05-15 MED ORDER — LEUPROLIDE ACETATE (6 MONTH) 45 MG ~~LOC~~ KIT: 45.0000 mg | PACK | Freq: Once | SUBCUTANEOUS | Status: DC

## 2022-05-15 NOTE — Patient Instructions (Addendum)
Please take the following dietary supplements for the duration of your hormone suppression therapy to reduce your risk for bone loss: -Calcium 1000-'1200mg'$  daily -Vitamin D 800-1000IU daily   Leuprolide Suspension for Injection (Prostate Cancer) What is this medication? LEUPROLIDE (loo PROE lide) reduces the symptoms of prostate cancer. It works by decreasing levels of the hormone testosterone in the body. This prevents prostate cancer cells from spreading or growing. This medicine may be used for other purposes; ask your health care provider or pharmacist if you have questions. COMMON BRAND NAME(S): Eligard, Fensolvi, Lupron Depot, Lupron Depot-Ped, Lutrate Depot, Viadur What should I tell my care team before I take this medication? They need to know if you have any of these conditions: Diabetes Heart disease Heart failure High or low levels of electrolytes, such as magnesium, potassium, or sodium in your blood Irregular heartbeat or rhythm Seizures An unusual or allergic reaction to leuprolide, other medications, foods, dyes, or preservatives Pregnant or trying to get pregnant Breast-feeding How should I use this medication? This medication is injected under the skin or into a muscle. It is given by your care team in a hospital or clinic setting. Talk to your care team about the use of this medication in children. Special care may be needed. Overdosage: If you think you have taken too much of this medicine contact a poison control center or emergency room at once. NOTE: This medicine is only for you. Do not share this medicine with others. What if I miss a dose? Keep appointments for follow-up doses. It is important not to miss your dose. Call your care team if you are unable to keep an appointment. What may interact with this medication? Do not take this medication with any of the following: Cisapride Dronedarone Ketoconazole Levoketoconazole Pimozide Thioridazine This medication  may also interact with the following: Other medications that cause heart rhythm changes This list may not describe all possible interactions. Give your health care provider a list of all the medicines, herbs, non-prescription drugs, or dietary supplements you use. Also tell them if you smoke, drink alcohol, or use illegal drugs. Some items may interact with your medicine. What should I watch for while using this medication? Visit your care team for regular checks on your progress. Tell your care team if your symptoms do not start to get better or if they get worse. This medication may increase blood sugar. The risk may be higher in patients who already have diabetes. Ask your care team what you can do to lower the risk of diabetes while taking this medication. This medication may cause infertility. Talk to your care team if you are concerned about your fertility. Heart attacks and strokes have been reported with the use of this medication. Get emergency help if you develop signs or symptoms of a heart attack or stroke. Talk to your care team about the risks and benefits of this medication. What side effects may I notice from receiving this medication? Side effects that you should report to your care team as soon as possible: Allergic reactions--skin rash, itching, hives, swelling of the face, lips, tongue, or throat Heart attack--pain or tightness in the chest, shoulders, arms, or jaw, nausea, shortness of breath, cold or clammy skin, feeling faint or lightheaded Heart rhythm changes--fast or irregular heartbeat, dizziness, feeling faint or lightheaded, chest pain, trouble breathing High blood sugar (hyperglycemia)--increased thirst or amount of urine, unusual weakness or fatigue, blurry vision Mood swings, irritability, hostility Seizures Stroke--sudden numbness or weakness of the  face, arm, or leg, trouble speaking, confusion, trouble walking, loss of balance or coordination, dizziness, severe  headache, change in vision Thoughts of suicide or self-harm, worsening mood, feelings of depression Side effects that usually do not require medical attention (report to your care team if they continue or are bothersome): Bone pain Change in sex drive or performance General discomfort and fatigue Hot flashes Muscle pain Pain, redness, or irritation at injection site Swelling of the ankles, hands, or feet This list may not describe all possible side effects. Call your doctor for medical advice about side effects. You may report side effects to FDA at 1-800-FDA-1088. Where should I keep my medication? This medication is given in a hospital or clinic. It will not be stored at home. NOTE: This sheet is a summary. It may not cover all possible information. If you have questions about this medicine, talk to your doctor, pharmacist, or health care provider.  2023 Elsevier/Gold Standard (2021-11-19 00:00:00)

## 2022-05-15 NOTE — Progress Notes (Signed)
Patient presented to clinic today for initiation of indefinite ADT with Mills Koller per Zara Council, PA-C.   Patient reports difficulty with ambulation due to his cardiac comorbidities. 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  He has a history of CAD and CHF managed by Lars Masson, AGNP-C at [facility] .  We discussed the anticipated side effects of ADT today including hot flashes, decreased libido, erectile dysfunction, breast tenderness or size changes, fatigue, weight gain, bone loss, muscle mass loss, brain fog, increased cholesterol, increased blood pressure, increased blood sugar, and increased risk for stroke and heart attack.  I counseled him to maintain a healthy diet and continue regular exercise including weightbearing exercise to mitigate bone and muscle loss for the duration of therapy.  Additionally, I counseled him to start daily calcium 1000-'1200mg'$  and vitamin D 800-1000IU supplements to reduce bone loss.  Lastly, I encouraged him to maintain regular follow-ups with his cardiologist. He expressed understanding, all questions answered. Printed resources on ADT provided today.  Patient to continue finasteride. Will plan for Eligard in 1 month and PSA in 3 months.  Debroah Loop, PA-C 05/15/22 11:17 AM  I spent 15 minutes on the day of the encounter to include pre-visit record review, face-to-face time with the patient, and post-visit ordering of tests.

## 2022-05-15 NOTE — Progress Notes (Signed)
[employer] Sub Q Injection  Due to Prostate Cancer patient is present today for a [employer] Injection.   Medication: Mills Koller (Degarelix)  Dose: '240mg'$  Location: right & left upper abdomen Lot: [id number] Exp: 05/25  Patient tolerated well, no complications were noted  Performed by: Fonnie Jarvis, [place], PAC  Follow up: 1 month Eligard

## 2022-05-29 ENCOUNTER — Telehealth: Payer: Self-pay

## 2022-05-29 NOTE — Telephone Encounter (Signed)
Approval for Eligard obtained via [place].  Dates: 05/29/2022 - 05/30/23 Auth # [id number]

## 2022-06-04 ENCOUNTER — Emergency Department: Payer: Medicare Other

## 2022-06-04 ENCOUNTER — Other Ambulatory Visit: Payer: Self-pay

## 2022-06-04 ENCOUNTER — Encounter: Payer: Self-pay | Admitting: Internal Medicine

## 2022-06-04 ENCOUNTER — Inpatient Hospital Stay: Payer: Medicare Other

## 2022-06-04 ENCOUNTER — Inpatient Hospital Stay
Admission: EM | Admit: 2022-06-04 | Discharge: 2022-06-23 | DRG: 683 | Disposition: E | Payer: Medicare Other | Source: Ambulatory Visit | Attending: Internal Medicine | Admitting: Internal Medicine

## 2022-06-04 DIAGNOSIS — I472 Ventricular tachycardia, unspecified: Secondary | ICD-10-CM | POA: Diagnosis not present

## 2022-06-04 DIAGNOSIS — I959 Hypotension, unspecified: Secondary | ICD-10-CM | POA: Diagnosis not present

## 2022-06-04 DIAGNOSIS — C61 Malignant neoplasm of prostate: Secondary | ICD-10-CM | POA: Diagnosis present

## 2022-06-04 DIAGNOSIS — N139 Obstructive and reflux uropathy, unspecified: Secondary | ICD-10-CM | POA: Diagnosis not present

## 2022-06-04 DIAGNOSIS — N1832 Chronic kidney disease, stage 3b: Secondary | ICD-10-CM | POA: Diagnosis present

## 2022-06-04 DIAGNOSIS — I493 Ventricular premature depolarization: Secondary | ICD-10-CM | POA: Diagnosis present

## 2022-06-04 DIAGNOSIS — N189 Chronic kidney disease, unspecified: Secondary | ICD-10-CM | POA: Diagnosis not present

## 2022-06-04 DIAGNOSIS — G629 Polyneuropathy, unspecified: Secondary | ICD-10-CM | POA: Diagnosis present

## 2022-06-04 DIAGNOSIS — N179 Acute kidney failure, unspecified: Secondary | ICD-10-CM | POA: Diagnosis present

## 2022-06-04 DIAGNOSIS — N401 Enlarged prostate with lower urinary tract symptoms: Secondary | ICD-10-CM | POA: Diagnosis present

## 2022-06-04 DIAGNOSIS — I5032 Chronic diastolic (congestive) heart failure: Secondary | ICD-10-CM | POA: Diagnosis present

## 2022-06-04 DIAGNOSIS — E871 Hypo-osmolality and hyponatremia: Secondary | ICD-10-CM | POA: Diagnosis present

## 2022-06-04 DIAGNOSIS — Z66 Do not resuscitate: Secondary | ICD-10-CM | POA: Diagnosis present

## 2022-06-04 DIAGNOSIS — R600 Localized edema: Secondary | ICD-10-CM | POA: Diagnosis present

## 2022-06-04 DIAGNOSIS — R778 Other specified abnormalities of plasma proteins: Secondary | ICD-10-CM | POA: Diagnosis not present

## 2022-06-04 DIAGNOSIS — I10 Essential (primary) hypertension: Secondary | ICD-10-CM | POA: Diagnosis present

## 2022-06-04 DIAGNOSIS — E876 Hypokalemia: Secondary | ICD-10-CM | POA: Diagnosis present

## 2022-06-04 DIAGNOSIS — N138 Other obstructive and reflux uropathy: Secondary | ICD-10-CM | POA: Diagnosis present

## 2022-06-04 DIAGNOSIS — R799 Abnormal finding of blood chemistry, unspecified: Secondary | ICD-10-CM | POA: Diagnosis not present

## 2022-06-04 DIAGNOSIS — J9811 Atelectasis: Secondary | ICD-10-CM | POA: Diagnosis not present

## 2022-06-04 DIAGNOSIS — E785 Hyperlipidemia, unspecified: Secondary | ICD-10-CM | POA: Diagnosis present

## 2022-06-04 DIAGNOSIS — Z79899 Other long term (current) drug therapy: Secondary | ICD-10-CM

## 2022-06-04 DIAGNOSIS — M1990 Unspecified osteoarthritis, unspecified site: Secondary | ICD-10-CM | POA: Diagnosis present

## 2022-06-04 DIAGNOSIS — D631 Anemia in chronic kidney disease: Secondary | ICD-10-CM | POA: Diagnosis present

## 2022-06-04 DIAGNOSIS — I248 Other forms of acute ischemic heart disease: Secondary | ICD-10-CM | POA: Diagnosis present

## 2022-06-04 DIAGNOSIS — I272 Pulmonary hypertension, unspecified: Secondary | ICD-10-CM | POA: Diagnosis present

## 2022-06-04 DIAGNOSIS — Z7982 Long term (current) use of aspirin: Secondary | ICD-10-CM

## 2022-06-04 DIAGNOSIS — Z8042 Family history of malignant neoplasm of prostate: Secondary | ICD-10-CM | POA: Diagnosis not present

## 2022-06-04 DIAGNOSIS — I130 Hypertensive heart and chronic kidney disease with heart failure and stage 1 through stage 4 chronic kidney disease, or unspecified chronic kidney disease: Secondary | ICD-10-CM | POA: Diagnosis present

## 2022-06-04 DIAGNOSIS — R001 Bradycardia, unspecified: Secondary | ICD-10-CM | POA: Diagnosis not present

## 2022-06-04 DIAGNOSIS — I5030 Unspecified diastolic (congestive) heart failure: Secondary | ICD-10-CM | POA: Diagnosis not present

## 2022-06-04 DIAGNOSIS — E861 Hypovolemia: Secondary | ICD-10-CM | POA: Diagnosis present

## 2022-06-04 DIAGNOSIS — I5033 Acute on chronic diastolic (congestive) heart failure: Secondary | ICD-10-CM | POA: Diagnosis not present

## 2022-06-04 DIAGNOSIS — D509 Iron deficiency anemia, unspecified: Secondary | ICD-10-CM | POA: Diagnosis present

## 2022-06-04 DIAGNOSIS — I5031 Acute diastolic (congestive) heart failure: Secondary | ICD-10-CM | POA: Diagnosis not present

## 2022-06-04 DIAGNOSIS — N186 End stage renal disease: Secondary | ICD-10-CM | POA: Diagnosis not present

## 2022-06-04 DIAGNOSIS — I340 Nonrheumatic mitral (valve) insufficiency: Secondary | ICD-10-CM | POA: Diagnosis present

## 2022-06-04 DIAGNOSIS — E875 Hyperkalemia: Secondary | ICD-10-CM | POA: Diagnosis present

## 2022-06-04 DIAGNOSIS — Z992 Dependence on renal dialysis: Secondary | ICD-10-CM | POA: Diagnosis not present

## 2022-06-04 DIAGNOSIS — Z515 Encounter for palliative care: Secondary | ICD-10-CM | POA: Diagnosis not present

## 2022-06-04 LAB — URINALYSIS, ROUTINE W REFLEX MICROSCOPIC
Bilirubin Urine: NEGATIVE
Glucose, UA: NEGATIVE mg/dL
Ketones, ur: NEGATIVE mg/dL
Leukocytes,Ua: NEGATIVE
Nitrite: NEGATIVE
Protein, ur: 30 mg/dL — AB
Specific Gravity, Urine: 1.006 (ref 1.005–1.030)
pH: 5 (ref 5.0–8.0)

## 2022-06-04 LAB — CBC WITH DIFFERENTIAL/PLATELET
Abs Immature Granulocytes: 0.02 10*3/uL (ref 0.00–0.07)
Basophils Absolute: 0 10*3/uL (ref 0.0–0.1)
Basophils Relative: 0 %
Eosinophils Absolute: 0 10*3/uL (ref 0.0–0.5)
Eosinophils Relative: 0 %
HCT: 25.1 % — ABNORMAL LOW (ref 39.0–52.0)
Hemoglobin: 8.6 g/dL — ABNORMAL LOW (ref 13.0–17.0)
Immature Granulocytes: 0 %
Lymphocytes Relative: 12 %
Lymphs Abs: 0.9 10*3/uL (ref 0.7–4.0)
MCH: 31.6 pg (ref 26.0–34.0)
MCHC: 34.3 g/dL (ref 30.0–36.0)
MCV: 92.3 fL (ref 80.0–100.0)
Monocytes Absolute: 0.8 10*3/uL (ref 0.1–1.0)
Monocytes Relative: 10 %
Neutro Abs: 5.9 10*3/uL (ref 1.7–7.7)
Neutrophils Relative %: 78 %
Platelets: 185 10*3/uL (ref 150–400)
RBC: 2.72 MIL/uL — ABNORMAL LOW (ref 4.22–5.81)
RDW: 16 % — ABNORMAL HIGH (ref 11.5–15.5)
WBC: 7.6 10*3/uL (ref 4.0–10.5)
nRBC: 0 % (ref 0.0–0.2)

## 2022-06-04 LAB — COMPREHENSIVE METABOLIC PANEL
ALT: 43 U/L (ref 0–44)
AST: 80 U/L — ABNORMAL HIGH (ref 15–41)
Albumin: 3 g/dL — ABNORMAL LOW (ref 3.5–5.0)
Alkaline Phosphatase: 96 U/L (ref 38–126)
Anion gap: 15 (ref 5–15)
BUN: 79 mg/dL — ABNORMAL HIGH (ref 8–23)
CO2: 23 mmol/L (ref 22–32)
Calcium: 8.9 mg/dL (ref 8.9–10.3)
Chloride: 88 mmol/L — ABNORMAL LOW (ref 98–111)
Creatinine, Ser: 4.65 mg/dL — ABNORMAL HIGH (ref 0.61–1.24)
GFR, Estimated: 11 mL/min — ABNORMAL LOW (ref >60)
Glucose, Bld: 124 mg/dL — ABNORMAL HIGH (ref 70–99)
Potassium: 3 mmol/L — ABNORMAL LOW (ref 3.5–5.1)
Sodium: 126 mmol/L — ABNORMAL LOW (ref 135–145)
Total Bilirubin: 0.8 mg/dL (ref 0.3–1.2)
Total Protein: 5.9 g/dL — ABNORMAL LOW (ref 6.5–8.1)

## 2022-06-04 LAB — BRAIN NATRIURETIC PEPTIDE: B Natriuretic Peptide: 2819.7 pg/mL — ABNORMAL HIGH (ref 0.0–100.0)

## 2022-06-04 LAB — TROPONIN I (HIGH SENSITIVITY)
Troponin I (High Sensitivity): 245 ng/L (ref <18)
Troponin I (High Sensitivity): 239 ng/L (ref <18)

## 2022-06-04 LAB — MAGNESIUM: Magnesium: 2.5 mg/dL — ABNORMAL HIGH (ref 1.7–2.4)

## 2022-06-04 LAB — TSH: TSH: 2.501 u[IU]/mL (ref 0.350–4.500)

## 2022-06-04 MED ORDER — HEPARIN SODIUM (PORCINE) 5000 UNIT/ML IJ SOLN
5000.0000 [IU] | Freq: Three times a day (TID) | INTRAMUSCULAR | Status: DC
  Administered 2022-06-04 – 2022-06-11 (×20): 5000 [IU] via SUBCUTANEOUS
  Filled 2022-06-04 (×20): qty 1

## 2022-06-04 MED ORDER — VITAMIN B-12 100 MCG PO TABS
100.0000 ug | ORAL_TABLET | Freq: Every day | ORAL | Status: DC
  Administered 2022-06-05 – 2022-06-10 (×5): 100 ug via ORAL
  Filled 2022-06-04 (×7): qty 1

## 2022-06-04 MED ORDER — ACETAMINOPHEN 325 MG PO TABS
650.0000 mg | ORAL_TABLET | Freq: Four times a day (QID) | ORAL | Status: AC | PRN
  Administered 2022-06-05 – 2022-06-09 (×8): 650 mg via ORAL
  Filled 2022-06-04 (×8): qty 2

## 2022-06-04 MED ORDER — CALCIUM GLUCONATE-NACL 1-0.675 GM/50ML-% IV SOLN
1.0000 g | Freq: Once | INTRAVENOUS | Status: AC
  Administered 2022-06-04: 1000 mg via INTRAVENOUS
  Filled 2022-06-04: qty 50

## 2022-06-04 MED ORDER — HYDRALAZINE HCL 10 MG PO TABS: 10.0000 mg | ORAL_TABLET | Freq: Four times a day (QID) | ORAL | Status: AC | PRN

## 2022-06-04 MED ORDER — ROSUVASTATIN CALCIUM 10 MG PO TABS
20.0000 mg | ORAL_TABLET | Freq: Every day | ORAL | Status: DC
  Administered 2022-06-04 – 2022-06-10 (×7): 20 mg via ORAL
  Filled 2022-06-04 (×7): qty 2

## 2022-06-04 MED ORDER — POTASSIUM CHLORIDE 10 MEQ/100ML IV SOLN
10.0000 meq | INTRAVENOUS | Status: AC
  Administered 2022-06-04 (×2): 10 meq via INTRAVENOUS
  Filled 2022-06-04 (×2): qty 100

## 2022-06-04 MED ORDER — FINASTERIDE 5 MG PO TABS
5.0000 mg | ORAL_TABLET | Freq: Every evening | ORAL | Status: DC
  Administered 2022-06-04 – 2022-06-10 (×7): 5 mg via ORAL
  Filled 2022-06-04 (×7): qty 1

## 2022-06-04 MED ORDER — ONDANSETRON HCL 4 MG PO TABS: 4.0000 mg | ORAL_TABLET | Freq: Four times a day (QID) | ORAL | Status: AC | PRN

## 2022-06-04 MED ORDER — ACETAMINOPHEN 650 MG RE SUPP: 650.0000 mg | Freq: Four times a day (QID) | RECTAL | Status: AC | PRN

## 2022-06-04 MED ORDER — ASPIRIN 81 MG PO TBEC
81.0000 mg | DELAYED_RELEASE_TABLET | Freq: Every day | ORAL | Status: DC
  Administered 2022-06-05: 81 mg via ORAL
  Filled 2022-06-04: qty 1

## 2022-06-04 MED ORDER — ONDANSETRON HCL 4 MG/2ML IJ SOLN: 4.0000 mg | Freq: Four times a day (QID) | INTRAMUSCULAR | Status: AC | PRN

## 2022-06-04 MED ORDER — POTASSIUM CHLORIDE 20 MEQ PO PACK
40.0000 meq | PACK | Freq: Once | ORAL | Status: AC
  Administered 2022-06-04: 40 meq via ORAL
  Filled 2022-06-04: qty 2

## 2022-06-04 MED ORDER — SENNOSIDES-DOCUSATE SODIUM 8.6-50 MG PO TABS: 1.0000 | ORAL_TABLET | Freq: Every evening | ORAL | Status: DC | PRN

## 2022-06-04 MED ORDER — SODIUM CHLORIDE 0.9 % IV SOLN: INTRAVENOUS | Status: AC

## 2022-06-04 MED ORDER — POTASSIUM CHLORIDE CRYS ER 20 MEQ PO TBCR
20.0000 meq | EXTENDED_RELEASE_TABLET | Freq: Once | ORAL | Status: AC
  Administered 2022-06-04: 20 meq via ORAL
  Filled 2022-06-04: qty 1

## 2022-06-04 NOTE — ED Triage Notes (Signed)
Pt here c/o abnormal lab values. Pt states he went to his primary yesterday to have blood work done and was told to come to the ED for kidney failure. Pt also c/o right leg swelling. Pt states he has been on several diuretics in the past.

## 2022-06-04 NOTE — Assessment & Plan Note (Addendum)
-   Finasteride 5 mg daily resumed for every evening Patient has a foley cathter in place.   Prostate cancer. CT abdomen and pelvis with retroperitoneal nodes, indicative of metastatic disease.  Follow up with urology as outpatient.

## 2022-06-04 NOTE — Assessment & Plan Note (Addendum)
CKD stage 3b.  Hyponatremia, hyperkalemia   Patient with worsening renal function with serum cr at 6.0 with BUN 101, k 4,6 and serum bicarbonate at 21.   Patient had his temporal HD cathter placed today, right femoral vein.  Plan to start patient on renal replacement therapy.   Anemia of chronic renal disease.  hgb is 8,6

## 2022-06-04 NOTE — Assessment & Plan Note (Addendum)
Hypotension.   Patient had hypotension 96/40 post procedure.  Added one time bolus 250 ml of NS Continue close blood pressure monitoring.

## 2022-06-04 NOTE — Assessment & Plan Note (Signed)
-   Presumed secondary to AKI - Status post replacement slowly with potassium chloride 20 mill equivalents p.o., potassium chloride 10 mill equivalent x2 by IV - Repeat BMP in the a.m.

## 2022-06-04 NOTE — ED Notes (Signed)
MD Isaacs aware Trop 245. Pt resting, alert and oriented in NAD with family at bedside HR continues SB high 30-40's. Pt states HR is "always this low" BLE pitting edema. RLE is red and warm to the touch. + pulses BLE. Will monitor.

## 2022-06-04 NOTE — ED Notes (Signed)
Bladder scan 475> MD notified

## 2022-06-04 NOTE — ED Provider Notes (Signed)
[facility] Provider Note    Event Date/Time   First MD Initiated Contact with Patient 05/27/2022 [phone]     (approximate)   History   Abnormal Labs   HPI  Aaron Grimes is a 88 y.o. male here with abnormal lab.  The patient presents after being told that he had abnormal labs and possible kidney failure on his labs yesterday.  Patient reportedly has had worsening bilateral leg edema for the last month to month.  He has been seen by his PCP for this.  He has seen a cardiologist as well and its been having his Lasix increased as well as Bumex added.  He reports that over the last week, despite having this, he has had decreased and essentially minimal urine output.  He believes that this started after he was given an injection for what he describes as prostate cancer at his urologist as an outpatient several weeks ago.  He denies any chest pain.  He went to have outpatient labs drawn at his PCP yesterday, who told him to come to the ER immediately for evaluation.  No other complaints.  No fevers or chills.  Of note, his right leg is always more swollen than the left, denies known history of DVT.     Physical Exam   Triage Vital Signs: ED Triage Vitals  Enc Vitals Group     BP      Pulse      Resp      Temp      Temp src      SpO2      Weight      Height      Head Circumference      Peak Flow      Pain Score      Pain Loc      Pain Edu?      Excl. in [place]?     Most recent vital signs: Vitals:   06/19/2022 1330 06/09/2022 1400  BP: (!) 129/52 121/62  Pulse: (!) 42 (!) 46  Resp: 12 16  Temp:  97.7 F (36.5 C)  SpO2: 100% 97%     General: Awake, no distress.  CV:  Good peripheral perfusion. RRR.  Resp:  Normal effort. Normal WOB. No rales. Abd:  No distention. No tenderness. Other:  3+ pitting edema BL LE, worse on R with diffuse erythema throughout the leg. DP pulses 2+ and symmetric. Toes WWP.   ED Results / Procedures / Treatments    Labs (all labs ordered are listed, but only abnormal results are displayed) Labs Reviewed  CBC WITH DIFFERENTIAL/PLATELET - Abnormal; Notable for the following components:      Result Value   RBC 2.72 (*)    Hemoglobin 8.6 (*)    HCT 25.1 (*)    RDW 16.0 (*)    All other components within normal limits  COMPREHENSIVE METABOLIC PANEL - Abnormal; Notable for the following components:   Sodium 126 (*)    Potassium 3.0 (*)    Chloride 88 (*)    Glucose, Bld 124 (*)    BUN 79 (*)    Creatinine, Ser 4.65 (*)    Total Protein 5.9 (*)    Albumin 3.0 (*)    AST 80 (*)    GFR, Estimated 11 (*)    All other components within normal limits  MAGNESIUM - Abnormal; Notable for the following components:   Magnesium 2.5 (*)    All other components  within normal limits  BRAIN NATRIURETIC PEPTIDE - Abnormal; Notable for the following components:   B Natriuretic Peptide 2,819.7 (*)    All other components within normal limits  URINALYSIS, ROUTINE W REFLEX MICROSCOPIC - Abnormal; Notable for the following components:   Color, Urine YELLOW (*)    APPearance HAZY (*)    Hgb urine dipstick MODERATE (*)    Protein, ur 30 (*)    Bacteria, UA RARE (*)    All other components within normal limits  TROPONIN I (HIGH SENSITIVITY) - Abnormal; Notable for the following components:   Troponin I (High Sensitivity) 245 (*)    All other components within normal limits  TROPONIN I (HIGH SENSITIVITY) - Abnormal; Notable for the following components:   Troponin I (High Sensitivity) 239 (*)    All other components within normal limits  URINE CULTURE     EKG Junctional rhythm, ventricular rate 42.  QRS 131, QTc 373.  No acute ST elevations or depressions.   RADIOLOGY Chest x-ray: No active disease CT stone: Bilateral perinephric stranding concerning for renal dysfunction or obstruction, bilateral pleural effusions, possible metastatic disease of the rib   I also independently reviewed and agree with  radiologist interpretations.   PROCEDURES:  Critical Care performed: No   MEDICATIONS ORDERED IN ED: Medications  0.9 %  sodium chloride infusion ( Intravenous New Bag/Given 06/09/2022 1143)  acetaminophen (TYLENOL) tablet 650 mg (has no administration in time range)    Or  acetaminophen (TYLENOL) suppository 650 mg (has no administration in time range)  ondansetron (ZOFRAN) tablet 4 mg (has no administration in time range)    Or  ondansetron (ZOFRAN) injection 4 mg (has no administration in time range)  heparin injection 5,000 Units (has no administration in time range)  senna-docusate (Senokot-S) tablet 1 tablet (has no administration in time range)  hydrALAZINE (APRESOLINE) tablet 10 mg (has no administration in time range)  calcium gluconate 1 g/ 50 mL sodium chloride IVPB (0 mg Intravenous Stopped 06/13/2022 1052)  potassium chloride 10 mEq in 100 mL IVPB (0 mEq Intravenous Stopped 06/18/2022 1523)  potassium chloride SA (KLOR-CON M) CR tablet 20 mEq (20 mEq Oral Given 06/14/2022 1251)     IMPRESSION / MDM / ASSESSMENT AND PLAN / ED COURSE  I reviewed the triage vital signs and the nursing notes.                               The patient is on the cardiac monitor to evaluate for evidence of arrhythmia and/or significant heart rate changes.   Ddx:  Differential includes the following, with pertinent life- or limb-threatening emergencies considered:  Acute urinary retention causing renal failure, primary renal failure, renal failure due to diuresis, heart failure, anasarca, liver failure/metastases, anemia  Patient's presentation is most consistent with acute presentation with potential threat to life or bodily function.  MDM:  88 year old male with history of chronic bradycardia, shortness of breath, coronary disease, heart failure, here with worsening renal function.  Clinically, suspect acute renal failure with anasarca, possibly CHF.  His lungs, however, are clear.  Vital signs  are stable.  EKG shows bradycardia which apparently is his baseline per review of records and outpatient cardiology notes.  He has had no hypotension.  CBC shows mild anemia.  CMP with worsening acute on chronic renal failure with BUN 79, creatinine 4.65.  Interestingly, potassium is 3.  Sodium 126 likely due to hypovolemia.  BNP markedly elevated and troponin elevated likely due to demand.  Again he has no chest pain.  Will cautiously replete potassium.  On bedside scanning, patient had significant urinary retention.  Foley placed with greater than 700 cc of urine out.  CT stone shows bilateral renal stranding likely due to his recent obstruction which now seems to be relieved.  Discussed with Dr. Candiss Norse of nephrology, will admit to medicine.  We will plan to monitor his blood pressure in the setting of his diuresis, replete fluid as needed but patient will likely need diuresis given his profound hypervolemia.     MEDICATIONS GIVEN IN ED: Medications  0.9 %  sodium chloride infusion ( Intravenous New Bag/Given 06/20/2022 1143)  acetaminophen (TYLENOL) tablet 650 mg (has no administration in time range)    Or  acetaminophen (TYLENOL) suppository 650 mg (has no administration in time range)  ondansetron (ZOFRAN) tablet 4 mg (has no administration in time range)    Or  ondansetron (ZOFRAN) injection 4 mg (has no administration in time range)  heparin injection 5,000 Units (has no administration in time range)  senna-docusate (Senokot-S) tablet 1 tablet (has no administration in time range)  hydrALAZINE (APRESOLINE) tablet 10 mg (has no administration in time range)  calcium gluconate 1 g/ 50 mL sodium chloride IVPB (0 mg Intravenous Stopped 06/22/2022 1052)  potassium chloride 10 mEq in 100 mL IVPB (0 mEq Intravenous Stopped 06/17/2022 1523)  potassium chloride SA (KLOR-CON M) CR tablet 20 mEq (20 mEq Oral Given 06/07/2022 1251)     Consults:  Dr. Candiss Norse with Nephrology    EMR reviewed       FINAL  CLINICAL IMPRESSION(S) / ED DIAGNOSES   Final diagnoses:  AKI (acute kidney injury) ([place])  Obstructive uropathy  Hypokalemia     Rx / DC Orders   ED Discharge Orders     None        Note:  This document was prepared using Dragon voice recognition software and may include unintentional dictation errors.   Duffy Bruce, MD 06/12/2022 1537

## 2022-06-04 NOTE — Hospital Course (Addendum)
Mr. Aaron Grimes is an 88 year old male with neuropathy, hypertension, hyperlipidemia, BPH, presents emergency department for chief concerns of abnormal labs via PCP.  Initial vitals in the emergency department showed temperature of 97.5, respiration rate of 13, heart rate of 40, blood pressure 112/49, SPO2 of 98% on room air.  Serum sodium is 126, potassium 3.0, chloride of 88, bicarb 23, BUN of 79, serum creatinine of 4.65, e GFR was 11, nonfasting blood glucose is 124.  WBC 7.6, hemoglobin 8.6, platelets of 185.  BNP was elevated at 2819.7.  High sensitive troponin was 245.  EDP discussed the patient with Dr. Candiss Norse who recommends Foley placement and correct potassium and admission with renal monitoring.  CT renal stone study: Bilateral perinephritic stranding without hydronephrosis.  Findings could reflect renal dysfunction or slight increase in generalized findings of mild anasarca.  Small bilateral pleural effusion with by basilar atelectasis.  Query benign finding process such as fibrous dysplasia versus monostotic metastatic process in the right posterior eighth rib.  Small retroperitoneal lymph nodes and pelvic lymph nodes.  Portable chest x-ray was read as no evidence of acute cardiopulmonary disease.  ED treatment: Potassium chloride 20 mill equivalent p.o. one-time dose, calcium gluconate 1 g IV, potassium chloride 10 mill equivalent IV x2 doses.  Patient had worsening renal function.   09/18 temporal HD cathter placed for renal replacement therapy.

## 2022-06-04 NOTE — H&P (Signed)
History and Physical   Aaron Grimes YJE:[id number] DOB: [birth year] DOA: 06/12/2022  PCP: Lenard Simmer, MD  Outpatient Specialists: Dr. Jennings Books, neurology Patient coming from: Home  I have personally briefly reviewed patient's old medical records in [place].  Chief Concern: Abnormal labs, poor urine output  HPI: Aaron Grimes is an 88 year old male with neuropathy, hypertension, hyperlipidemia, BPH, presents emergency department for chief concerns of abnormal labs via PCP.  Initial vitals in the emergency department showed temperature of 97.5, respiration rate of 13, heart rate of 40, blood pressure 112/49, SPO2 of 98% on room air.  Serum sodium is 126, potassium 3.0, chloride of 88, bicarb 23, BUN of 79, serum creatinine of 4.65, e GFR was 11, nonfasting blood glucose is 124.  WBC 7.6, hemoglobin 8.6, platelets of 185.  BNP was elevated at 2819.7.  High sensitive troponin was 245.  EDP discussed the patient with Dr. Candiss Norse who recommends Foley placement and correct potassium and admission with renal monitoring.  CT renal stone study: Bilateral perinephritic stranding without hydronephrosis.  Findings could reflect renal dysfunction or slight increase in generalized findings of mild anasarca.  Small bilateral pleural effusion with by basilar atelectasis.  Query benign finding process such as fibrous dysplasia versus monostotic metastatic process in the right posterior eighth rib.  Small retroperitoneal lymph nodes and pelvic lymph nodes.  Portable chest x-ray was read as no evidence of acute cardiopulmonary disease.  ED treatment: Potassium chloride 20 mill equivalent p.o. one-time dose, calcium gluconate 1 g IV, potassium chloride 10 mill equivalent IV x2 doses.  At bedside patient is able to tell me his name, age, current location, he knows his daughter at bedside.  He reports worsening shortness of breath with lower extremity swelling and decreased  urine output for approximately 2 weeks. He reports he went to his PCP and labs were obtained which showed he had worsening renal function. He was advised by his outpatient provider office that he needed to present to the emergency department for further evaluation. He has been given furosemide/Lasix  At bedside he denies chest pain, abdominal pain, diarrhea, syncope, loss of conscious, fever, chills, nausea, vomiting.  He denies unintentional weight changes.  He denies black stools and or blood in his stools.  Social history: He lives at home with his spouse.  He denies tobacco, EtOH, recreational drug use.  He is retired and formerly was a Land.  ROS: Constitutional: no weight change, no fever ENT/Mouth: no sore throat, no rhinorrhea Eyes: no eye pain, no vision changes Cardiovascular: no chest pain, + dyspnea,  + bilateral lower extremity edema with redness on the right lower extremity, no palpitations Respiratory: no cough, no sputum, no wheezing Gastrointestinal: no nausea, no vomiting, no diarrhea, no constipation Genitourinary: no urinary incontinence, no dysuria, no hematuria, decreased urine output Musculoskeletal: no arthralgias, no myalgias Skin: no skin lesions, no pruritus, Neuro: + weakness, no loss of consciousness, no syncope Psych: no anxiety, no depression, + decrease appetite Heme/Lymph: no bruising, no bleeding  ED Course: Discussed with emergency medicine provider, patient requiring hospitalization for chief concerns of acute kidney injury.  Assessment/Plan  Principal Problem:   AKI (acute kidney injury) ([place]) Active Problems:   BPH with obstruction/lower urinary tract symptoms   Hypokalemia   Essential hypertension   Assessment and Plan:  * AKI (acute kidney injury) ([place]) - Presumptive diagnosis is post renal secondary to obstruction caused by enlarged prostate with probable prostate carcinoma - Status post Foley  placement - Strict I's and  O's - Recheck BMP in the a.m.  Essential hypertension - Holding home lisinopril due to AKI - Hydralazine 10 mg p.o. every 6 hours as needed for SBP greater than 180, 4 days ordered  Hypokalemia - Presumed secondary to AKI - Status post replacement slowly with potassium chloride 20 mill equivalents p.o., potassium chloride 10 mill equivalent x2 by IV - Repeat BMP in the a.m.  BPH with obstruction/lower urinary tract symptoms - Finasteride 5 mg daily resumed for every evening  Chart reviewed.   DVT prophylaxis: Heparin 5000 units subcutaneous every 8 hours Code Status: DNR, confirmed with patient and daughter Jocelyn Lamer at bedside Diet: Renal diet Family Communication: Updated daughter Jocelyn Lamer at bedside Disposition Plan: Pending clinical course Consults called: Nephrology Admission status: Telemetry cardiac, inpatient  Past Medical History:  Diagnosis Date   Arthritis    BPH (benign prostatic hypertrophy)    Elevated PSA    HLD (hyperlipidemia)    HTN (hypertension)    Past Surgical History:  Procedure Laterality Date   HAND SURGERY Right    nodule removed   Social History:  reports that he has never smoked. He has never used smokeless tobacco. He reports that he does not drink alcohol and does not use drugs.  No Known Allergies Family History  Problem Relation Age of Onset   Prostate cancer Father        (not sure)   Bone cancer Father    Kidney disease Neg Hx    Kidney cancer Neg Hx    Bladder Cancer Neg Hx    Family history: Family history reviewed and not pertinent  Prior to Admission medications   Medication Sig Start Date End Date Taking? Authorizing Provider  Alpha-Lipoic Acid 600 MG TABS  01/05/22   [provider]  aspirin EC 81 MG tablet Take 81 mg by mouth daily. Swallow whole.    [provider]  cyanocobalamin 100 MCG tablet Take 100 mcg by mouth daily.    [provider]  ferrous sulfate 325 (65 FE) MG tablet Take by mouth.     [provider]  finasteride (PROSCAR) 5 MG tablet Take 1 tablet (5 mg total) by mouth daily. 10/02/21   Zara Council A, PA-C  furosemide (LASIX) 20 MG tablet Take 20 mg by mouth 2 (two) times daily. 04/05/22   [provider]  gabapentin (NEURONTIN) 600 MG tablet Take by mouth. 08/15/20 09/20/21  [provider]  lisinopril (PRINIVIL,ZESTRIL) 10 MG tablet  12/21/18   [provider]  rosuvastatin (CRESTOR) 20 MG tablet Take 20 mg by mouth daily.    [provider]  Vitamin D, Ergocalciferol, (DRISDOL) 50000 units CAPS capsule Take 50,000 Units by mouth every 7 (seven) days.    [provider]   Physical Exam: Vitals:   06/14/2022 1130 06/19/2022 1300 06/07/2022 1330 06/15/2022 1400  BP: (!) 122/59 137/62 (!) 129/52 121/62  Pulse: (!) 44 97 (!) 42 (!) 46  Resp: '12 17 12 16  '$ Temp:  98 F (36.7 C)  97.7 F (36.5 C)  TempSrc:      SpO2: 99% 95% 100% 97%  Weight:    66.1 kg  Height:       Constitutional: appears age-appropriate, frail, NAD, calm, comfortable Eyes: PERRL, lids and conjunctivae normal ENMT: Mucous membranes are moist. Posterior pharynx clear of any exudate or lesions. Age-appropriate dentition. Hearing appropriate Neck: normal, supple, no masses, no thyromegaly Respiratory: clear to auscultation bilaterally, no  wheezing, no crackles. Normal respiratory effort. No accessory muscle use.  Cardiovascular: Regular rate and rhythm, no murmurs / rubs / gallops. No extremity edema. 2+ pedal pulses. No carotid bruits.  Abdomen: no tenderness, no masses palpated, no hepatosplenomegaly. Bowel sounds positive.  Musculoskeletal: no clubbing / cyanosis. No joint deformity upper and lower extremities. Good ROM, no contractures, no atrophy. Normal muscle tone.  Skin: no rashes, lesions, ulcers. No induration Neurologic: Sensation intact. Strength 5/5 in all 4.  Psychiatric: Normal judgment and insight. Alert and oriented x 3. Normal mood.    EKG: independently reviewed, showing junctional rhythm with rate of 42, QTc 373  Chest x-ray on Admission: I personally reviewed and I agree with radiologist reading as below.  US Venous Img Lower Bilateral  Result Date: 06/08/2022 CLINICAL DATA:  Bilateral lower extremity edema. EXAM: BILATERAL LOWER EXTREMITY VENOUS DOPPLER ULTRASOUND TECHNIQUE: Gray-scale sonography with graded compression, as well as color Doppler and duplex ultrasound were performed to evaluate the lower extremity deep venous systems from the level of the common femoral vein and including the common femoral, femoral, profunda femoral, popliteal and calf veins including the posterior tibial, peroneal and gastrocnemius veins when visible. The superficial great saphenous vein was also interrogated. Spectral Doppler was utilized to evaluate flow at rest and with distal augmentation maneuvers in the common femoral, femoral and popliteal veins. COMPARISON:  None Available. FINDINGS: RIGHT LOWER EXTREMITY Common Femoral Vein: No evidence of thrombus. Normal compressibility, respiratory phasicity and response to augmentation. Saphenofemoral Junction: No evidence of thrombus. Normal compressibility and flow on color Doppler imaging. Profunda Femoral Vein: No evidence of thrombus. Normal compressibility and flow on color Doppler imaging. Femoral Vein: No evidence of thrombus. Normal compressibility, respiratory phasicity and response to augmentation. Popliteal Vein: No evidence of thrombus. Normal compressibility, respiratory phasicity and response to augmentation. Calf Veins: No evidence of thrombus. Normal compressibility and flow on color Doppler imaging. Superficial Great Saphenous Vein: No evidence of thrombus. Normal compressibility. Venous Reflux:  None. Other Findings: No evidence of superficial thrombophlebitis or abnormal fluid collection. LEFT LOWER EXTREMITY Common Femoral Vein: No evidence of thrombus. Normal compressibility,  respiratory phasicity and response to augmentation. Saphenofemoral Junction: No evidence of thrombus. Normal compressibility and flow on color Doppler imaging. Profunda Femoral Vein: No evidence of thrombus. Normal compressibility and flow on color Doppler imaging. Femoral Vein: No evidence of thrombus. Normal compressibility, respiratory phasicity and response to augmentation. Popliteal Vein: No evidence of thrombus. Normal compressibility, respiratory phasicity and response to augmentation. Calf Veins: No evidence of thrombus. Normal compressibility and flow on color Doppler imaging. Superficial Great Saphenous Vein: No evidence of thrombus. Normal compressibility. Venous Reflux:  None. Other Findings: No evidence of superficial thrombophlebitis. Popliteal fossa cyst on the left measures roughly 4 x 1.4 x 3 cm and is consistent with a Baker's cyst. IMPRESSION: 1. No evidence of deep vein thrombosis in either lower extremity. 2. Baker's cyst of left popliteal fossa measuring up to 4 cm in maximum diameter. Electronically Signed   By: Aletta Edouard M.D.   On: 06/03/2022 13:52   CT Renal Stone Study  Result Date: 06/08/2022 CLINICAL DATA:  Bladder neck obstruction by report. * Tracking Code: BO * EXAM: CT ABDOMEN AND PELVIS WITHOUT CONTRAST TECHNIQUE: Multidetector CT imaging of the abdomen and pelvis was performed following the standard protocol without IV contrast. RADIATION DOSE REDUCTION: This exam was performed according to the departmental dose-optimization program which includes automated exposure control, adjustment of the mA and/or kV according to patient  size and/or use of iterative reconstruction technique. COMPARISON:  April 23, 2022 FINDINGS: Lower chest: Small bilateral pleural effusions. Basilar atelectasis. No dense consolidation. Heart size is enlarged with extensive coronary artery calcification. Low-attenuation cardiac blood pools is likely indicative of anemia. Hepatobiliary: Smooth hepatic  contours. No pericholecystic stranding. No gross biliary duct distension. No visible lesion on noncontrast imaging. Cholelithiasis. Pancreas: Pancreas normal contours, no signs of inflammation or peripancreatic fluid. Spleen: Normal. Adrenals/Urinary Tract: Adrenal glands are normal. Perinephric stranding bilaterally without perinephric fluid. Generalized increase in body wall edema signs of mild anasarca makes this finding nonspecific. There is no hydronephrosis. Adrenal glands are normal. Urinary bladder partially collapsed with Foley catheter in-situ. No focal perinephric stranding. Stomach/Bowel: Stomach without signs of acute process. Mildly distended fluid-filled distal small bowel loops. No signs of bowel obstruction. Normal appendix. No pericolonic stranding. Vascular/Lymphatic: Calcified aortic atherosclerosis without aneurysm. Small lymph nodes in the retroperitoneum and pelvis better delineated on recent CT. Reproductive: Prostate not well assessed on CT. Prostate is not enlarged substantially Other: Small volume free fluid in the pelvis is nonspecific and perhaps minimally increased from previous imaging. No pneumoperitoneum. Musculoskeletal: No acute bone finding. Lucent area in RIGHT posterior eighth rib at the costovertebral angle with mild expansion of the rib is of uncertain significance. Mild increased radio tracer accumulation in this location is noted on previous imaging. Spinal degenerative changes. Body wall edema slightly increased compared to previous imaging. IMPRESSION: 1. Bilateral perinephric stranding without hydronephrosis. Findings could reflect sequela of renal dysfunction or be related to slight increase in generalized findings of mild anasarca. Would also correlate with urinalysis to exclude urinary tract infection. 2. Small bilateral pleural effusions with bibasilar atelectasis. 3. Query benign findings of process such as fibrous dysplasia versus is monostotic metastatic process in  the RIGHT posterior eighth rib. Correlation with PSA and attention on follow-up is suggested. 4. Small retroperitoneal lymph nodes and pelvic lymph nodes better seen on contrasted CT, please refer to previous imaging for oncologic details. 5. Cholelithiasis. 6. Aortic atherosclerosis. Aortic Atherosclerosis (ICD10-I70.0). Electronically Signed   By: Zetta Bills M.D.   On: 05/28/2022 11:55   DG Chest Portable 1 View  Result Date: 06/20/2022 CLINICAL DATA:  Bilateral leg swelling EXAM: PORTABLE CHEST 1 VIEW COMPARISON:  None Available. FINDINGS: The cardiomediastinal silhouette is within normal limits in size. There is no focal airspace consolidation. There is no pleural effusion. No pneumothorax. There is no acute osseous abnormality. Bilateral glenohumeral osteoarthritis with ossified joint bodies on the right. Thoracic spondylosis.  Defibrillator pads overlie the chest. IMPRESSION: No evidence of acute cardiopulmonary disease. Electronically Signed   By: Maurine Simmering M.D.   On: 06/03/2022 10:18    Labs on Admission: I have personally reviewed following labs  CBC: Recent Labs  Lab 06/03/2022 0945  WBC 7.6  NEUTROABS 5.9  HGB 8.6*  HCT 25.1*  MCV 92.3  PLT 124   Basic Metabolic Panel: Recent Labs  Lab 06/08/2022 0945  NA 126*  K 3.0*  CL 88*  CO2 23  GLUCOSE 124*  BUN 79*  CREATININE 4.65*  CALCIUM 8.9  MG 2.5*   GFR: Estimated Creatinine Clearance: 10.1 mL/min (A) (by C-G formula based on SCr of 4.65 mg/dL (H)).  Liver Function Tests: Recent Labs  Lab 06/20/2022 0945  AST 80*  ALT 43  ALKPHOS 96  BILITOT 0.8  PROT 5.9*  ALBUMIN 3.0*   Dr. Tobie Poet [facility]  If 7PM-7AM, please contact overnight-coverage provider If 7AM-7PM, please contact day  coverage provider [email or web address]  05/29/2022, 5:40 PM

## 2022-06-05 DIAGNOSIS — D509 Iron deficiency anemia, unspecified: Secondary | ICD-10-CM | POA: Diagnosis not present

## 2022-06-05 DIAGNOSIS — I5030 Unspecified diastolic (congestive) heart failure: Secondary | ICD-10-CM

## 2022-06-05 DIAGNOSIS — R778 Other specified abnormalities of plasma proteins: Secondary | ICD-10-CM

## 2022-06-05 DIAGNOSIS — N179 Acute kidney failure, unspecified: Secondary | ICD-10-CM | POA: Diagnosis not present

## 2022-06-05 LAB — CBC
HCT: 22.1 % — ABNORMAL LOW (ref 39.0–52.0)
Hemoglobin: 7.8 g/dL — ABNORMAL LOW (ref 13.0–17.0)
MCH: 32 pg (ref 26.0–34.0)
MCHC: 35.3 g/dL (ref 30.0–36.0)
MCV: 90.6 fL (ref 80.0–100.0)
Platelets: 161 10*3/uL (ref 150–400)
RBC: 2.44 MIL/uL — ABNORMAL LOW (ref 4.22–5.81)
RDW: 15.9 % — ABNORMAL HIGH (ref 11.5–15.5)
WBC: 5.5 10*3/uL (ref 4.0–10.5)
nRBC: 0 % (ref 0.0–0.2)

## 2022-06-05 LAB — URINE CULTURE: Culture: NO GROWTH

## 2022-06-05 LAB — BASIC METABOLIC PANEL
Anion gap: 13 (ref 5–15)
BUN: 81 mg/dL — ABNORMAL HIGH (ref 8–23)
CO2: 21 mmol/L — ABNORMAL LOW (ref 22–32)
Calcium: 8.6 mg/dL — ABNORMAL LOW (ref 8.9–10.3)
Chloride: 92 mmol/L — ABNORMAL LOW (ref 98–111)
Creatinine, Ser: 4.81 mg/dL — ABNORMAL HIGH (ref 0.61–1.24)
GFR, Estimated: 11 mL/min — ABNORMAL LOW (ref >60)
Glucose, Bld: 119 mg/dL — ABNORMAL HIGH (ref 70–99)
Potassium: 4.1 mmol/L (ref 3.5–5.1)
Sodium: 126 mmol/L — ABNORMAL LOW (ref 135–145)

## 2022-06-05 LAB — IRON AND TIBC
Iron: 37 ug/dL — ABNORMAL LOW (ref 45–182)
Saturation Ratios: 14 % — ABNORMAL LOW (ref 17.9–39.5)
TIBC: 258 ug/dL (ref 250–450)
UIBC: 221 ug/dL

## 2022-06-05 MED ORDER — ORAL CARE MOUTH RINSE: 15.0000 mL | OROMUCOSAL | Status: DC | PRN

## 2022-06-05 MED ORDER — TAMSULOSIN HCL 0.4 MG PO CAPS: 0.4000 mg | ORAL_CAPSULE | Freq: Every day | ORAL | Status: DC

## 2022-06-05 NOTE — Plan of Care (Signed)
  Problem: Education: Goal: Knowledge of disease and its progression will improve Outcome: Progressing   Problem: Clinical Measurements: Goal: Complications related to the disease process or treatment will be avoided or minimized Outcome: Progressing   Problem: Fluid Volume: Goal: Fluid volume balance will be maintained or improved Outcome: Progressing   Problem: Urinary Elimination: Goal: Progression of disease will be identified and treated Outcome: Progressing

## 2022-06-05 NOTE — Consult Note (Signed)
[facility]  CONSULT NOTE    Date: 06/05/2022                  Patient Name:  Aaron Grimes  MRN: [id number]  DOB: [birth year]  Age / Sex: 88 y.o., male         PCP: Lenard Simmer, MD                 Service Requesting Consult: [place]                 Reason for Consult: Acute kidney injury            History of Present Illness: Mr. Aaron Grimes is a 88 y.o.  male with past medical conditions including hypertension, BPH, hyperlipidemia, and nephropathy, suspected prostate cancer, who was admitted to [facility] on 05/28/2022 for AKI (acute kidney injury) [facility]) [N17.9]  Patient was encouraged to come to the emergency department due to abnormal labs seen by PCP.  Patient states he has been followed closely for the past couple months by cardiology and his primary care physician due to increased shortness of breath and lower extremity edema.  Patient states cardiology recently increased diuretics and also administered some IV Lasix in office to help manage edema.  Patient states as he continue to receive diuretics, urine output decreased.  Patient is seen today sitting up in chair, daughter at bedside.  Room air.  Appetite remains appropriate, denies nausea, vomiting, or diarrhea.  Reports shortness of breath with exertion, daughter states this has progressively worsened over the past couple of weeks.  Chart review confirms multiple recent diuretic adjustments prior to admission due to shortness of breath and lower extremity edema.  Patient also prescribed meloxicam for short time however this is now stopped.  Concerning labs on admission include sodium 126, potassium 3.0, glucose 124, BUN 79, creatinine 4.65 with GFR 11, magnesium 2.5, and albumin 3.0.  BNP greater than 2800 and troponin 245.  Hemoglobin decreased to 8.6.  Chest x-ray negative.  CT renal stone negative for hydronephrosis, fibrous dysplasia versus metastatic process on right eighth rib in  correlation to elevated PSA.  Urine culture negative.  Lower extremity Doppler negative for DVT.  Medications: Outpatient medications: Medications Prior to Admission  Medication Sig Dispense Refill Last Dose   aspirin EC 81 MG tablet Take 81 mg by mouth daily. Swallow whole.   06/03/2022   cyanocobalamin 100 MCG tablet Take 100 mcg by mouth daily.   05/26/2022   finasteride (PROSCAR) 5 MG tablet Take 1 tablet (5 mg total) by mouth daily. 90 tablet 3 06/03/2022   lisinopril (PRINIVIL,ZESTRIL) 10 MG tablet    06/03/2022   rosuvastatin (CRESTOR) 20 MG tablet Take 20 mg by mouth daily.   06/03/2022   torsemide (DEMADEX) 20 MG tablet Take 40 mg by mouth 2 (two) times daily.   Past Week   Alpha-Lipoic Acid 600 MG TABS    Unknown   Vitamin D, Ergocalciferol, (DRISDOL) 50000 units CAPS capsule Take 50,000 Units by mouth every 7 (seven) days.   Unknown    Current medications: Current Facility-Administered Medications  Medication Dose Route Frequency Provider Last Rate Last Admin   acetaminophen (TYLENOL) tablet 650 mg  650 mg Oral Q6H PRN Cox, Amy N, DO   650 mg at 06/05/22 0451   Or   acetaminophen (TYLENOL) suppository 650 mg  650 mg Rectal Q6H PRN Cox, Amy N, DO       aspirin  EC tablet 81 mg  81 mg Oral Daily Cox, Amy N, DO   81 mg at 06/05/22 0933   finasteride (PROSCAR) tablet 5 mg  5 mg Oral QPM Cox, Amy N, DO   5 mg at 06/16/2022 1824   heparin injection 5,000 Units  5,000 Units Subcutaneous Q8H Cox, Amy N, DO   5,000 Units at 06/05/22 6712   hydrALAZINE (APRESOLINE) tablet 10 mg  10 mg Oral Q6H PRN Cox, Amy N, DO       ondansetron (ZOFRAN) tablet 4 mg  4 mg Oral Q6H PRN Cox, Amy N, DO       Or   ondansetron (ZOFRAN) injection 4 mg  4 mg Intravenous Q6H PRN Cox, Amy N, DO       Oral care mouth rinse  15 mL Mouth Rinse PRN Cox, Amy N, DO       rosuvastatin (CRESTOR) tablet 20 mg  20 mg Oral QHS Cox, Amy N, DO   20 mg at 06/13/2022 2326   senna-docusate (Senokot-S) tablet 1 tablet  1 tablet Oral  QHS PRN Cox, Amy N, DO       vitamin B-12 (CYANOCOBALAMIN) tablet 100 mcg  100 mcg Oral Daily Cox, Amy N, DO   100 mcg at 06/05/22 4580      Allergies: No Known Allergies    Past Medical History: Past Medical History:  Diagnosis Date   Arthritis    BPH (benign prostatic hypertrophy)    Elevated PSA    HLD (hyperlipidemia)    HTN (hypertension)      Past Surgical History: Past Surgical History:  Procedure Laterality Date   HAND SURGERY Right    nodule removed     Family History: Family History  Problem Relation Age of Onset   Prostate cancer Father        (not sure)   Bone cancer Father    Kidney disease Neg Hx    Kidney cancer Neg Hx    Bladder Cancer Neg Hx      Social History: Social History   Socioeconomic History   Marital status: Married    Spouse name: Not on file   Number of children: Not on file   Years of education: Not on file   Highest education level: Not on file  Occupational History   Not on file  Tobacco Use   Smoking status: Never   Smokeless tobacco: Never  Vaping Use   Vaping Use: Never used  Substance and Sexual Activity   Alcohol use: No    Alcohol/week: 0.0 standard drinks of alcohol   Drug use: No   Sexual activity: Yes    Birth control/protection: None  Other Topics Concern   Not on file  Social History Narrative   Not on file   Social Determinants of Health   Financial Resource Strain: Not on file  Food Insecurity: No Food Insecurity (06/09/2022)   Hunger Vital Sign    Worried About Running Out of Food in the Last Year: Never true    Ran Out of Food in the Last Year: Never true  Transportation Needs: No Transportation Needs (06/03/2022)   PRAPARE - Hydrologist (Medical): No    Lack of Transportation (Non-Medical): No  Physical Activity: Not on file  Stress: Not on file  Social Connections: Not on file  Intimate Partner Violence: Not At Risk (06/22/2022)   Humiliation, Afraid, Rape, and  Kick questionnaire    Fear of Current  or Ex-Partner: No    Emotionally Abused: No    Physically Abused: No    Sexually Abused: No     Review of Systems: Review of Systems  Constitutional:  Positive for malaise/fatigue. Negative for chills and fever.  HENT:  Negative for congestion, sore throat and tinnitus.   Eyes:  Negative for blurred vision and redness.  Respiratory:  Positive for shortness of breath. Negative for cough and wheezing.   Cardiovascular:  Negative for chest pain, palpitations, claudication and leg swelling.  Gastrointestinal:  Negative for abdominal pain, blood in stool, diarrhea, nausea and vomiting.  Genitourinary:  Negative for flank pain, frequency and hematuria.  Musculoskeletal:  Negative for back pain, falls and myalgias.  Skin:  Negative for rash.  Neurological:  Negative for dizziness, weakness and headaches.  Endo/Heme/Allergies:  Does not bruise/bleed easily.  Psychiatric/Behavioral:  Negative for depression. The patient is not nervous/anxious and does not have insomnia.     Vital Signs: Blood pressure (!) 104/53, pulse (!) 101, temperature 98 F (36.7 C), temperature source Oral, resp. rate 18, height '5\' 8"'$  (1.727 m), weight 66.1 kg, SpO2 (!) 74 %.  Weight trends: Filed Weights   06/05/2022 0928 06/15/2022 1400  Weight: 66.7 kg 66.1 kg    Physical Exam: General: NAD, resting comfortably  Head: Normocephalic, atraumatic. Moist oral mucosal membranes  Eyes: Anicteric  Lungs:  Basilar wheeze, normal effort, room air  Heart: Regular rate and rhythm sinus bradycardia  Abdomen:  Soft, nontender, nontender  Extremities: 2-3+ bilateral peripheral edema to upper thigh, right greater than left.  Neurologic: Nonfocal, moving all four extremities  Skin: No lesions  Access: None     Lab results: Basic Metabolic Panel: Recent Labs  Lab 05/28/2022 0945 06/05/22 0625  NA 126* 126*  K 3.0* 4.1  CL 88* 92*  CO2 23 21*  GLUCOSE 124* 119*  BUN 79* 81*   CREATININE 4.65* 4.81*  CALCIUM 8.9 8.6*  MG 2.5*  --     Liver Function Tests: Recent Labs  Lab 06/15/2022 0945  AST 80*  ALT 43  ALKPHOS 96  BILITOT 0.8  PROT 5.9*  ALBUMIN 3.0*   No results for input(s): "LIPASE", "AMYLASE" in the last 168 hours. No results for input(s): "AMMONIA" in the last 168 hours.  CBC: Recent Labs  Lab 06/16/2022 0945 06/05/22 0625  WBC 7.6 5.5  NEUTROABS 5.9  --   HGB 8.6* 7.8*  HCT 25.1* 22.1*  MCV 92.3 90.6  PLT 185 161    Cardiac Enzymes: No results for input(s): "CKTOTAL", "CKMB", "CKMBINDEX", "TROPONINI" in the last 168 hours.  BNP: Invalid input(s): "POCBNP"  CBG: No results for input(s): "GLUCAP" in the last 168 hours.  Microbiology: Results for orders placed or performed in visit on 10/12/19  Novel Coronavirus, NAA ([facility])     Status: Abnormal   Collection Time: 10/12/19 10:19 AM   Specimen: Nasopharyngeal(NP) swabs in vial transport medium   NASOPHARYNGE  TESTING  Result Value Ref Range Status   SARS-CoV-2, NAA Detected (A) Not Detected Final    Comment: This nucleic acid amplification test was developed and its performance characteristics determined by [employer]. Nucleic acid amplification tests include RT-PCR and TMA. This test has not been FDA cleared or approved. This test has been authorized by FDA under an Emergency Use Authorization (EUA). This test is only authorized for the duration of time the declaration that circumstances exist justifying the authorization of the emergency use of in vitro diagnostic tests for detection  of SARS-CoV-2 virus and/or diagnosis of COVID-19 infection under section 564(b)(1) of the Act, 21 U.S.C. 606TKZ-6(W) (1), unless the authorization is terminated or revoked sooner. When diagnostic testing is negative, the possibility of a false negative result should be considered in the context of a patient's recent exposures and the presence of clinical signs and  symptoms consistent with COVID-19. An individual without symptoms of COVID-19 and who is not shedding SARS-CoV-2 virus wo uld expect to have a negative (not detected) result in this assay.     Coagulation Studies: No results for input(s): "LABPROT", "INR" in the last 72 hours.  Urinalysis: Recent Labs    06/15/2022 1214  [place] 1.006  PHURINE 5.0  GLUCOSEU NEGATIVE  HGBUR MODERATE*  BILIRUBINUR NEGATIVE  KETONESUR NEGATIVE  PROTEINUR 30*  NITRITE NEGATIVE  LEUKOCYTESUR NEGATIVE      Imaging: US Venous Img Lower Bilateral  Result Date: 06/03/2022 CLINICAL DATA:  Bilateral lower extremity edema. EXAM: BILATERAL LOWER EXTREMITY VENOUS DOPPLER ULTRASOUND TECHNIQUE: Gray-scale sonography with graded compression, as well as color Doppler and duplex ultrasound were performed to evaluate the lower extremity deep venous systems from the level of the common femoral vein and including the common femoral, femoral, profunda femoral, popliteal and calf veins including the posterior tibial, peroneal and gastrocnemius veins when visible. The superficial great saphenous vein was also interrogated. Spectral Doppler was utilized to evaluate flow at rest and with distal augmentation maneuvers in the common femoral, femoral and popliteal veins. COMPARISON:  None Available. FINDINGS: RIGHT LOWER EXTREMITY Common Femoral Vein: No evidence of thrombus. Normal compressibility, respiratory phasicity and response to augmentation. Saphenofemoral Junction: No evidence of thrombus. Normal compressibility and flow on color Doppler imaging. Profunda Femoral Vein: No evidence of thrombus. Normal compressibility and flow on color Doppler imaging. Femoral Vein: No evidence of thrombus. Normal compressibility, respiratory phasicity and response to augmentation. Popliteal Vein: No evidence of thrombus. Normal compressibility, respiratory phasicity and response to augmentation. Calf Veins: No evidence of  thrombus. Normal compressibility and flow on color Doppler imaging. Superficial Great Saphenous Vein: No evidence of thrombus. Normal compressibility. Venous Reflux:  None. Other Findings: No evidence of superficial thrombophlebitis or abnormal fluid collection. LEFT LOWER EXTREMITY Common Femoral Vein: No evidence of thrombus. Normal compressibility, respiratory phasicity and response to augmentation. Saphenofemoral Junction: No evidence of thrombus. Normal compressibility and flow on color Doppler imaging. Profunda Femoral Vein: No evidence of thrombus. Normal compressibility and flow on color Doppler imaging. Femoral Vein: No evidence of thrombus. Normal compressibility, respiratory phasicity and response to augmentation. Popliteal Vein: No evidence of thrombus. Normal compressibility, respiratory phasicity and response to augmentation. Calf Veins: No evidence of thrombus. Normal compressibility and flow on color Doppler imaging. Superficial Great Saphenous Vein: No evidence of thrombus. Normal compressibility. Venous Reflux:  None. Other Findings: No evidence of superficial thrombophlebitis. Popliteal fossa cyst on the left measures roughly 4 x 1.4 x 3 cm and is consistent with a Baker's cyst. IMPRESSION: 1. No evidence of deep vein thrombosis in either lower extremity. 2. Baker's cyst of left popliteal fossa measuring up to 4 cm in maximum diameter. Electronically Signed   By: Aletta Edouard M.D.   On: 05/29/2022 13:52   CT Renal Stone Study  Result Date: 05/24/2022 CLINICAL DATA:  Bladder neck obstruction by report. * Tracking Code: BO * EXAM: CT ABDOMEN AND PELVIS WITHOUT CONTRAST TECHNIQUE: Multidetector CT imaging of the abdomen and pelvis was performed following the standard protocol without IV contrast. RADIATION DOSE REDUCTION: This  exam was performed according to the departmental dose-optimization program which includes automated exposure control, adjustment of the mA and/or kV according to patient  size and/or use of iterative reconstruction technique. COMPARISON:  April 23, 2022 FINDINGS: Lower chest: Small bilateral pleural effusions. Basilar atelectasis. No dense consolidation. Heart size is enlarged with extensive coronary artery calcification. Low-attenuation cardiac blood pools is likely indicative of anemia. Hepatobiliary: Smooth hepatic contours. No pericholecystic stranding. No gross biliary duct distension. No visible lesion on noncontrast imaging. Cholelithiasis. Pancreas: Pancreas normal contours, no signs of inflammation or peripancreatic fluid. Spleen: Normal. Adrenals/Urinary Tract: Adrenal glands are normal. Perinephric stranding bilaterally without perinephric fluid. Generalized increase in body wall edema signs of mild anasarca makes this finding nonspecific. There is no hydronephrosis. Adrenal glands are normal. Urinary bladder partially collapsed with Foley catheter in-situ. No focal perinephric stranding. Stomach/Bowel: Stomach without signs of acute process. Mildly distended fluid-filled distal small bowel loops. No signs of bowel obstruction. Normal appendix. No pericolonic stranding. Vascular/Lymphatic: Calcified aortic atherosclerosis without aneurysm. Small lymph nodes in the retroperitoneum and pelvis better delineated on recent CT. Reproductive: Prostate not well assessed on CT. Prostate is not enlarged substantially Other: Small volume free fluid in the pelvis is nonspecific and perhaps minimally increased from previous imaging. No pneumoperitoneum. Musculoskeletal: No acute bone finding. Lucent area in RIGHT posterior eighth rib at the costovertebral angle with mild expansion of the rib is of uncertain significance. Mild increased radio tracer accumulation in this location is noted on previous imaging. Spinal degenerative changes. Body wall edema slightly increased compared to previous imaging. IMPRESSION: 1. Bilateral perinephric stranding without hydronephrosis. Findings could  reflect sequela of renal dysfunction or be related to slight increase in generalized findings of mild anasarca. Would also correlate with urinalysis to exclude urinary tract infection. 2. Small bilateral pleural effusions with bibasilar atelectasis. 3. Query benign findings of process such as fibrous dysplasia versus is monostotic metastatic process in the RIGHT posterior eighth rib. Correlation with PSA and attention on follow-up is suggested. 4. Small retroperitoneal lymph nodes and pelvic lymph nodes better seen on contrasted CT, please refer to previous imaging for oncologic details. 5. Cholelithiasis. 6. Aortic atherosclerosis. Aortic Atherosclerosis (ICD10-I70.0). Electronically Signed   By: Zetta Bills M.D.   On: 06/03/2022 11:55   DG Chest Portable 1 View  Result Date: 06/09/2022 CLINICAL DATA:  Bilateral leg swelling EXAM: PORTABLE CHEST 1 VIEW COMPARISON:  None Available. FINDINGS: The cardiomediastinal silhouette is within normal limits in size. There is no focal airspace consolidation. There is no pleural effusion. No pneumothorax. There is no acute osseous abnormality. Bilateral glenohumeral osteoarthritis with ossified joint bodies on the right. Thoracic spondylosis.  Defibrillator pads overlie the chest. IMPRESSION: No evidence of acute cardiopulmonary disease. Electronically Signed   By: Maurine Simmering M.D.   On: 06/15/2022 10:18     Assessment & Plan: Mr. Mohan Erven is a 88 y.o.  male with past medical conditions including hypertension, BPH, hyperlipidemia, and nephropathy, suspected prostate cancer, who was admitted to [facility] on 05/24/2022 for AKI (acute kidney injury) ([place]) [N17.9]  Acute kidney injury with hyponatremia likely due to aggressive diuretic therapy.  Patient also reported short-term NSAID use during this time.  Baseline creatinine 1.5 on 04/23/2022.  However patient had normal renal function earlier this year.  No recent IV contrast exposure.  Sodium 126 on admission.   Foley catheter placed due to enlarged prostate.  Continue to hold diuresis.  Patiently currently undergoing prostate cancer treatment.  Appetite remains appropriate  and patient encourage to maintain or increase fluid intake.  We expect renal function to improve with Foley catheter placement and decompression of bladder.  Continue to avoid nephrotoxic agents and therapies.  No acute need for dialysis at this time but will monitor closely.  2. Chronic diastolic heart failure. According to outpatient cardiology notes, ECHO has normal EF with a Grade 3 diastolic dysfunction. Presented with new LE edema and shortness of breath, resulting in diuretic therapy.  3. Hypertension, essential. Home regimen includes Furosemide and lisinopril. Both held due to kidney injury.     LOS: [place] 9/13/202312:10 PM

## 2022-06-05 NOTE — Consult Note (Signed)
Cardiology Consultation   Patient ID: Aaron Grimes MRN: [id number]; DOB: 1930-07-16  Admit date: 06/08/2022 Date of Consult: 06/05/2022  PCP:  Lenard Simmer, MD   [place] [facility] Cardiologist: [facility] Click here to update MD or APP on Care Team, Refresh:1}     Patient Profile:   Aaron Grimes is a 88 y.o. male with a hx of HFpEF, BPH, prostate cancer who is being seen 06/05/2022 for the evaluation of leg edema at the request of Dr. Tawanna Solo.  History of Present Illness:   Mr. Aaron Grimes is an 88 year old male with history of hypertension, HFpEF, BPH, prostate cancer with mets to abdominal lymph nodes presenting due to acute renal injury.  Patient being followed at [facility] from a cardiac perspective.  Due to worsening leg edema and volume overload, his diuretics/torsemide was increased to 40 mg 3 times daily from twice daily.  Primary cardiologist obtain lab work 3 weeks ago with abnormal kidney function.  Torsemide was decreased to 40 mg daily.  Patient followed up with primary care physician, compression stockings, leg raise was advised.  He states having poor urinary output despite increasing dose of diuretics.  He saw primary care physician 2 days ago for follow-up visit, was given IV diuretics, renal function lab work was obtained.  Was called later and advised to come to the emergency room due to worsening renal function/creatinine.  Denies chest pain.  He has a history of baseline bradycardia, Mobitz 1 AV block on cardiac monitor as per [facility] notes.  Last echocardiogram at [facility] 02/2022 showed EF 60 to 49%, grade 3 diastolic dysfunction.  NM stress test 01/2022 showed no ischemia.  Upon admission, creatinine was 4.6, much worse from 1.5 a month ago.  Hemoglobin 8.6, iron levels low.  Troponin was 245, 239, EKG showed sinus bradycardia.  Foley placed in the ED with good output, greater than 700 cc of urine noted.   Past Medical History:   Diagnosis Date   Arthritis    BPH (benign prostatic hypertrophy)    Elevated PSA    HLD (hyperlipidemia)    HTN (hypertension)     Past Surgical History:  Procedure Laterality Date   HAND SURGERY Right    nodule removed     Home Medications:  Prior to Admission medications   Medication Sig Start Date End Date Taking? Authorizing Provider  aspirin EC 81 MG tablet Take 81 mg by mouth daily. Swallow whole.   Yes [provider]  cyanocobalamin 100 MCG tablet Take 100 mcg by mouth daily.   Yes [provider]  finasteride (PROSCAR) 5 MG tablet Take 1 tablet (5 mg total) by mouth daily. 10/02/21  Yes McGowan, Larene Beach A, PA-C  lisinopril (PRINIVIL,ZESTRIL) 10 MG tablet  12/21/18  Yes [provider]  rosuvastatin (CRESTOR) 20 MG tablet Take 20 mg by mouth daily.   Yes [provider]  torsemide (DEMADEX) 20 MG tablet Take 40 mg by mouth 2 (two) times daily.   Yes [provider]  Alpha-Lipoic Acid 600 MG TABS  01/05/22   [provider]  Vitamin D, Ergocalciferol, (DRISDOL) 50000 units CAPS capsule Take 50,000 Units by mouth every 7 (seven) days.    [provider]    Inpatient Medications: Scheduled Meds:  aspirin EC  81 mg Oral Daily   finasteride  5 mg Oral QPM   heparin  5,000 Units Subcutaneous Q8H   rosuvastatin  20 mg Oral QHS   cyanocobalamin  100  mcg Oral Daily   Continuous Infusions:  PRN Meds: acetaminophen **OR** acetaminophen, hydrALAZINE, ondansetron **OR** ondansetron (ZOFRAN) IV, mouth rinse, senna-docusate  Allergies:   No Known Allergies  Social History:   Social History   Socioeconomic History   Marital status: Married    Spouse name: Not on file   Number of children: Not on file   Years of education: Not on file   Highest education level: Not on file  Occupational History   Not on file  Tobacco Use   Smoking status: Never   Smokeless tobacco: Never  Vaping Use   Vaping Use: Never used   Substance and Sexual Activity   Alcohol use: No    Alcohol/week: 0.0 standard drinks of alcohol   Drug use: No   Sexual activity: Yes    Birth control/protection: None  Other Topics Concern   Not on file  Social History Narrative   Not on file   Social Determinants of Health   Financial Resource Strain: Not on file  Food Insecurity: No Food Insecurity (06/21/2022)   Hunger Vital Sign    Worried About Running Out of Food in the Last Year: Never true    Ran Out of Food in the Last Year: Never true  Transportation Needs: No Transportation Needs (05/26/2022)   PRAPARE - Hydrologist (Medical): No    Lack of Transportation (Non-Medical): No  Physical Activity: Not on file  Stress: Not on file  Social Connections: Not on file  Intimate Partner Violence: Not At Risk (06/05/2022)   Humiliation, Afraid, Rape, and Kick questionnaire    Fear of Current or Ex-Partner: No    Emotionally Abused: No    Physically Abused: No    Sexually Abused: No    Family History:    Family History  Problem Relation Age of Onset   Prostate cancer Father        (not sure)   Bone cancer Father    Kidney disease Neg Hx    Kidney cancer Neg Hx    Bladder Cancer Neg Hx      ROS:  Please see the history of present illness.   All other ROS reviewed and negative.     Physical Exam/Data:   Vitals:   06/05/22 1118 06/05/22 1122 06/05/22 1137 06/05/22 1703  BP: (!) 98/46 (!) 91/52 (!) 104/53 131/67  Pulse: (!) 42 (!) 49 (!) 101 (!) 43  Resp: '16  18 18  '$ Temp: 98 F (36.7 C)  98 F (36.7 C) (!) 97.5 F (36.4 C)  TempSrc: Oral  Oral Oral  SpO2: 98%  (!) 74% 99%  Weight:      Height:        Intake/Output Summary (Last 24 hours) at 06/05/2022 1807 Last data filed at 06/05/2022 1400 Gross per 24 hour  Intake 480 ml  Output 175 ml  Net 305 ml      06/13/2022    2:00 PM 05/28/2022    9:28 AM 04/09/2022   11:19 AM  Last 3 Weights  Weight (lbs) 145 lb 12.8 oz 147 lb  0.8 oz 147 lb  Weight (kg) 66.134 kg 66.7 kg 66.679 kg     Body mass index is 22.17 kg/m.  General: Appears cachectic,  no acute distress HEENT: normal Neck: no JVD Vascular: No carotid bruits; Distal pulses 2+ bilaterally Cardiac: Bradycardic, regular, no murmur Lungs: Diminished breath sounds at bases, Abd: soft, nontender, no hepatomegaly  Ext: 2-3+ pitting edema  Musculoskeletal:  No deformities,  Skin: warm and dry  Neuro:  CNs 2-12 intact, no focal abnormalities noted Psych:  Normal affect   EKG:  The EKG was personally reviewed and demonstrates: Sinus bradycardia Telemetry:  Telemetry was personally reviewed and demonstrates: Sinus bradycardia  Relevant CV Studies:  echocardiogram at [facility] 02/2022 showed EF 60 to 18%, grade 3 diastolic dysfunction.   NM stress test 01/2022 showed no ischemia.  Laboratory Data:  High Sensitivity Troponin:   Recent Labs  Lab 06/08/2022 0945 06/12/2022 1147  TROPONINIHS 245* 239*     Chemistry Recent Labs  Lab 05/28/2022 0945 06/05/22 0625  NA 126* 126*  K 3.0* 4.1  CL 88* 92*  CO2 23 21*  GLUCOSE 124* 119*  BUN 79* 81*  CREATININE 4.65* 4.81*  CALCIUM 8.9 8.6*  MG 2.5*  --   GFRNONAA 11* 11*  ANIONGAP 15 13    Recent Labs  Lab 05/30/2022 0945  PROT 5.9*  ALBUMIN 3.0*  AST 80*  ALT 43  ALKPHOS 96  BILITOT 0.8   Lipids No results for input(s): "CHOL", "TRIG", "HDL", "LABVLDL", "LDLCALC", "CHOLHDL" in the last 168 hours.  Hematology Recent Labs  Lab 06/07/2022 0945 06/05/22 0625  WBC 7.6 5.5  RBC 2.72* 2.44*  HGB 8.6* 7.8*  HCT 25.1* 22.1*  MCV 92.3 90.6  MCH 31.6 32.0  MCHC 34.3 35.3  RDW 16.0* 15.9*  PLT 185 161   Thyroid  Recent Labs  Lab 06/03/2022 0945  TSH 2.501    BNP Recent Labs  Lab 06/03/2022 0945  BNP 2,819.7*    DDimer No results for input(s): "DDIMER" in the last 168 hours.   Radiology/Studies:  US Venous Img Lower Bilateral  Result Date: 05/25/2022 CLINICAL DATA:  Bilateral lower extremity  edema. EXAM: BILATERAL LOWER EXTREMITY VENOUS DOPPLER ULTRASOUND TECHNIQUE: Gray-scale sonography with graded compression, as well as color Doppler and duplex ultrasound were performed to evaluate the lower extremity deep venous systems from the level of the common femoral vein and including the common femoral, femoral, profunda femoral, popliteal and calf veins including the posterior tibial, peroneal and gastrocnemius veins when visible. The superficial great saphenous vein was also interrogated. Spectral Doppler was utilized to evaluate flow at rest and with distal augmentation maneuvers in the common femoral, femoral and popliteal veins. COMPARISON:  None Available. FINDINGS: RIGHT LOWER EXTREMITY Common Femoral Vein: No evidence of thrombus. Normal compressibility, respiratory phasicity and response to augmentation. Saphenofemoral Junction: No evidence of thrombus. Normal compressibility and flow on color Doppler imaging. Profunda Femoral Vein: No evidence of thrombus. Normal compressibility and flow on color Doppler imaging. Femoral Vein: No evidence of thrombus. Normal compressibility, respiratory phasicity and response to augmentation. Popliteal Vein: No evidence of thrombus. Normal compressibility, respiratory phasicity and response to augmentation. Calf Veins: No evidence of thrombus. Normal compressibility and flow on color Doppler imaging. Superficial Great Saphenous Vein: No evidence of thrombus. Normal compressibility. Venous Reflux:  None. Other Findings: No evidence of superficial thrombophlebitis or abnormal fluid collection. LEFT LOWER EXTREMITY Common Femoral Vein: No evidence of thrombus. Normal compressibility, respiratory phasicity and response to augmentation. Saphenofemoral Junction: No evidence of thrombus. Normal compressibility and flow on color Doppler imaging. Profunda Femoral Vein: No evidence of thrombus. Normal compressibility and flow on color Doppler imaging. Femoral Vein: No  evidence of thrombus. Normal compressibility, respiratory phasicity and response to augmentation. Popliteal Vein: No evidence of thrombus. Normal compressibility, respiratory phasicity and response to augmentation. Calf Veins: No evidence of thrombus. Normal compressibility and  flow on color Doppler imaging. Superficial Great Saphenous Vein: No evidence of thrombus. Normal compressibility. Venous Reflux:  None. Other Findings: No evidence of superficial thrombophlebitis. Popliteal fossa cyst on the left measures roughly 4 x 1.4 x 3 cm and is consistent with a Baker's cyst. IMPRESSION: 1. No evidence of deep vein thrombosis in either lower extremity. 2. Baker's cyst of left popliteal fossa measuring up to 4 cm in maximum diameter. Electronically Signed   By: Aletta Edouard M.D.   On: 06/02/2022 13:52   CT Renal Stone Study  Result Date: 06/01/2022 CLINICAL DATA:  Bladder neck obstruction by report. * Tracking Code: BO * EXAM: CT ABDOMEN AND PELVIS WITHOUT CONTRAST TECHNIQUE: Multidetector CT imaging of the abdomen and pelvis was performed following the standard protocol without IV contrast. RADIATION DOSE REDUCTION: This exam was performed according to the departmental dose-optimization program which includes automated exposure control, adjustment of the mA and/or kV according to patient size and/or use of iterative reconstruction technique. COMPARISON:  April 23, 2022 FINDINGS: Lower chest: Small bilateral pleural effusions. Basilar atelectasis. No dense consolidation. Heart size is enlarged with extensive coronary artery calcification. Low-attenuation cardiac blood pools is likely indicative of anemia. Hepatobiliary: Smooth hepatic contours. No pericholecystic stranding. No gross biliary duct distension. No visible lesion on noncontrast imaging. Cholelithiasis. Pancreas: Pancreas normal contours, no signs of inflammation or peripancreatic fluid. Spleen: Normal. Adrenals/Urinary Tract: Adrenal glands are  normal. Perinephric stranding bilaterally without perinephric fluid. Generalized increase in body wall edema signs of mild anasarca makes this finding nonspecific. There is no hydronephrosis. Adrenal glands are normal. Urinary bladder partially collapsed with Foley catheter in-situ. No focal perinephric stranding. Stomach/Bowel: Stomach without signs of acute process. Mildly distended fluid-filled distal small bowel loops. No signs of bowel obstruction. Normal appendix. No pericolonic stranding. Vascular/Lymphatic: Calcified aortic atherosclerosis without aneurysm. Small lymph nodes in the retroperitoneum and pelvis better delineated on recent CT. Reproductive: Prostate not well assessed on CT. Prostate is not enlarged substantially Other: Small volume free fluid in the pelvis is nonspecific and perhaps minimally increased from previous imaging. No pneumoperitoneum. Musculoskeletal: No acute bone finding. Lucent area in RIGHT posterior eighth rib at the costovertebral angle with mild expansion of the rib is of uncertain significance. Mild increased radio tracer accumulation in this location is noted on previous imaging. Spinal degenerative changes. Body wall edema slightly increased compared to previous imaging. IMPRESSION: 1. Bilateral perinephric stranding without hydronephrosis. Findings could reflect sequela of renal dysfunction or be related to slight increase in generalized findings of mild anasarca. Would also correlate with urinalysis to exclude urinary tract infection. 2. Small bilateral pleural effusions with bibasilar atelectasis. 3. Query benign findings of process such as fibrous dysplasia versus is monostotic metastatic process in the RIGHT posterior eighth rib. Correlation with PSA and attention on follow-up is suggested. 4. Small retroperitoneal lymph nodes and pelvic lymph nodes better seen on contrasted CT, please refer to previous imaging for oncologic details. 5. Cholelithiasis. 6. Aortic  atherosclerosis. Aortic Atherosclerosis (ICD10-I70.0). Electronically Signed   By: Zetta Bills M.D.   On: 05/29/2022 11:55   DG Chest Portable 1 View  Result Date: 05/25/2022 CLINICAL DATA:  Bilateral leg swelling EXAM: PORTABLE CHEST 1 VIEW COMPARISON:  None Available. FINDINGS: The cardiomediastinal silhouette is within normal limits in size. There is no focal airspace consolidation. There is no pleural effusion. No pneumothorax. There is no acute osseous abnormality. Bilateral glenohumeral osteoarthritis with ossified joint bodies on the right. Thoracic spondylosis.  Defibrillator pads overlie the  chest. IMPRESSION: No evidence of acute cardiopulmonary disease. Electronically Signed   By: Maurine Simmering M.D.   On: 06/02/2022 10:18     Assessment and Plan:   HFpEF, leg edema -Avoiding diuretics due to renal dysfunction -Hopefully renal function improves with placing Foley -Consider diuretics if renal function improves   2.  Elevated troponins -No chest pain -Possibly demand supply mismatch -Not a candidate for invasive procedures due to anemia, renal dysfunction -Last EF was normal -Hold aspirin due to anemia, continue Crestor  3.  Iron deficiency anemia -Hold aspirin -Fecal occult blood pending -Work-up as per medicine team  4.  AKI -obstructive uropathy 2/2 BPH, prostate cancer -S/p Foley with significant output -Monitor creatinine with Foley placement -Avoid nephrotoxic's, may need dialysis if renal function not improve -Nephrology following.  5.  BPH, prostate cancer -Management as per medicine team  Total encounter time more than 85 minutes  Greater than 50% was spent in counseling and coordination of care with the patient   Signed, Kate Sable, MD  06/05/2022 6:07 PM

## 2022-06-05 NOTE — Progress Notes (Signed)
Mobility Specialist - Progress Note  Post-mobility: SPO2 91%   06/05/22 1500  Mobility  Activity Ambulated with assistance in hallway  Level of Assistance Standby assist, set-up cues, supervision of patient - no hands on  Assistive Device Front wheel walker  Distance Ambulated (ft) 160 ft  Activity Response Tolerated well  $Mobility charge 1 Mobility   Pt sitting in chair upon entry, utilizing RA. Pt ambulated one lap around NS using RW with SBA, tolerated well. Pt left supine with needs within reach. No complaints.  Candie [place] Mobility Specialist 06/05/22 3:57 PM

## 2022-06-05 NOTE — Progress Notes (Signed)
PROGRESS NOTE  Aaron Grimes  KCM:[id number] DOB: [birth year] DOA: 05/29/2022 PCP: Lenard Simmer, MD   Brief Narrative:  Patient is a 88 year old male with history of neuropathy, hypertension, hyperlipidemia, BPH, prostate cancer who presented after his PCP referred him for the evaluation of abnormal labs.  On presentation ,he was hemodynamically stable.  Lab work showed sodium of 126, potassium of 3, creatinine of 4.65.  BNP was elevated.  Patient reported worsening shortness of breath, lower extremity swelling, decreased urine output for the last 2 weeks.  CT renal study showed bilateral perinephric stranding without hydronephrosis .urine retention was suspected and Foley was placed.  Nephrology consulted today.  Assessment & Plan:  Principal Problem:   AKI (acute kidney injury) ([place]) Active Problems:   BPH with obstruction/lower urinary tract symptoms   Hypokalemia   Essential hypertension   AKI: Baseline creatinine normal.  Creatinine was 3.1 on 8/23 which was done as an outpatient.  Patient referred here by his PCP. History of BPH/prostate cancer.  Also taking torsemide at home and not having good urine  output. CT renal study showed bilateral perinephric stranding without hydronephrosis .urine retention was suspected and Foley was placed.  Nephrology consulted today. Patient looks volume overloaded.  BNP elevated  Hypertension: Home lisinopril on hold due to AKI.  , continue prn Medications for severe hypertension.  Currently blood pressure soft  Hypokalemia: Supplemented with potassium  History of hyperlipidemia: Takes rosuvastatin  History of carotid artery disease: On aspirin at home  Normocytic anemia: No evidence of acute blood loss.  Hemoglobin currently in the range of 7-8.  Hemoglobin was 10.4 on 10/23/2021.  Patient taking iron pill.  He reports black stools but attributes that to iron pills.  We will check FOBT.  Severe bilateral lower extremity more on the  right: Likely from volume overload.  Venous Doppler did not show any DVT  Hyponatremia: This is most likely from volume overload.  Continue to monitor.  History of diastolic congestive heart failure: Follows with cardiology.  Echo done on 03/19/2022 showed EF of 60 to 05%, grade 2 diastolic dysfunction.  Follow-up with cardiology currently appears well overloaded, on torsemide  at home.elevated BNP.  We have requested for cardiology evaluation.  Elevated troponin: This is likely from demand ischemia.  Troponin significantly high with flat trend.  Denies any chest pain.  We will contact cardiology.  History BPH/prostate cancer: Continue finasteride.  He follows with urology.  Bone scan on 8/23 did not show any skeletal metastatic disease.  CT abdomen/pelvis done on 04/23/2022 showed small to borderline enlarged abdominal/pelvic retroperitoneal lymph nodes indicative of metastatic disease.  Urology planning to start on ADT but he wants to wait.  He does not want referral to the cancer center.  Recommend outpatient follow-up with urology  Debility/deconditioning: Patient looks very weak, deconditioned.  We will consult PT/OT       DVT prophylaxis:heparin injection 5,000 Units Start: 06/03/2022 2200 Place TED hose Start: 05/26/2022 1216     Code Status: DNR  Family Communication: Daughter at bedside on 9/13  Patient status:Inpatient  Patient is from :Home  Anticipated discharge WP:VXYI  Estimated DC date:not sure at the moment   Consultants: Nephrology  Procedures:None  Antimicrobials:  Anti-infectives (From admission, onward)    None       Subjective:  Seen and examined at the bedside today.  Appears overall comfortable, sitting on the chair.  Has severe bilateral lower extremity swelling more on the right lower extremity.  On  room air, speaking in full sentences.  Appears weak and malnourished.  Denies any worse cough or shortness of breath  Objective: Vitals:   06/05/22 0845  06/05/22 1118 06/05/22 1122 06/05/22 1137  BP: 109/64 (!) 98/46 (!) 91/52 (!) 104/53  Pulse: (!) 40 (!) 42 (!) 49 (!) 101  Resp: '20 16  18  '$ Temp: 97.6 F (36.4 C) 98 F (36.7 C)  98 F (36.7 C)  TempSrc: Oral Oral  Oral  SpO2: 99% 98%  (!) 74%  Weight:      Height:        Intake/Output Summary (Last 24 hours) at 06/05/2022 1324 Last data filed at 06/05/2022 0950 Gross per 24 hour  Intake 936.24 ml  Output 175 ml  Net 761.24 ml   Filed Weights   06/16/2022 0928 06/09/2022 1400  Weight: 66.7 kg 66.1 kg    Examination:  General exam: Very deconditioned, weak, chronically ill looking, pleasant elderly male HEENT: PERRL Respiratory system:  no wheezes or crackles  Cardiovascular system: S1 & S2 heard, RRR.  Gastrointestinal system: Abdomen is nondistended, soft and nontender. Central nervous system: Alert and oriented Extremities: Severe bilateral lower extremity pitting edema, no clubbing ,no cyanosis Skin: No rashes, no ulcers,no icterus   GU: Foley   Data Reviewed: I have personally reviewed following labs and imaging studies  CBC: Recent Labs  Lab 05/25/2022 0945 06/05/22 0625  WBC 7.6 5.5  NEUTROABS 5.9  --   HGB 8.6* 7.8*  HCT 25.1* 22.1*  MCV 92.3 90.6  PLT 185 413   Basic Metabolic Panel: Recent Labs  Lab 05/24/2022 0945 06/05/22 0625  NA 126* 126*  K 3.0* 4.1  CL 88* 92*  CO2 23 21*  GLUCOSE 124* 119*  BUN 79* 81*  CREATININE 4.65* 4.81*  CALCIUM 8.9 8.6*  MG 2.5*  --      Recent Results (from the past 240 hour(s))  Urine Culture     Status: None   Collection Time: 06/20/2022 12:14 PM   Specimen: Urine, Clean Catch  Result Value Ref Range Status   Specimen Description   Final    URINE, CLEAN CATCH Performed at [facility], [address] [place], [place] [address]    Special Requests   Final    NONE Performed at [facility], [address] [place], [place] [address]    Culture   Final    NO GROWTH Performed at [facility], [place] [address], [place],  [address]    Report Status 06/05/2022 FINAL  Final     Radiology Studies: US Venous Img Lower Bilateral  Result Date: 06/13/2022 CLINICAL DATA:  Bilateral lower extremity edema. EXAM: BILATERAL LOWER EXTREMITY VENOUS DOPPLER ULTRASOUND TECHNIQUE: Gray-scale sonography with graded compression, as well as color Doppler and duplex ultrasound were performed to evaluate the lower extremity deep venous systems from the level of the common femoral vein and including the common femoral, femoral, profunda femoral, popliteal and calf veins including the posterior tibial, peroneal and gastrocnemius veins when visible. The superficial great saphenous vein was also interrogated. Spectral Doppler was utilized to evaluate flow at rest and with distal augmentation maneuvers in the common femoral, femoral and popliteal veins. COMPARISON:  None Available. FINDINGS: RIGHT LOWER EXTREMITY Common Femoral Vein: No evidence of thrombus. Normal compressibility, respiratory phasicity and response to augmentation. Saphenofemoral Junction: No evidence of thrombus. Normal compressibility and flow on color Doppler imaging. Profunda Femoral Vein: No evidence of thrombus. Normal compressibility and  flow on color Doppler imaging. Femoral Vein: No evidence of thrombus. Normal compressibility, respiratory phasicity and response to augmentation. Popliteal Vein: No evidence of thrombus. Normal compressibility, respiratory phasicity and response to augmentation. Calf Veins: No evidence of thrombus. Normal compressibility and flow on color Doppler imaging. Superficial Great Saphenous Vein: No evidence of thrombus. Normal compressibility. Venous Reflux:  None. Other Findings: No evidence of superficial thrombophlebitis or abnormal fluid collection. LEFT LOWER EXTREMITY Common Femoral Vein: No evidence of thrombus. Normal compressibility, respiratory phasicity and response to augmentation.  Saphenofemoral Junction: No evidence of thrombus. Normal compressibility and flow on color Doppler imaging. Profunda Femoral Vein: No evidence of thrombus. Normal compressibility and flow on color Doppler imaging. Femoral Vein: No evidence of thrombus. Normal compressibility, respiratory phasicity and response to augmentation. Popliteal Vein: No evidence of thrombus. Normal compressibility, respiratory phasicity and response to augmentation. Calf Veins: No evidence of thrombus. Normal compressibility and flow on color Doppler imaging. Superficial Great Saphenous Vein: No evidence of thrombus. Normal compressibility. Venous Reflux:  None. Other Findings: No evidence of superficial thrombophlebitis. Popliteal fossa cyst on the left measures roughly 4 x 1.4 x 3 cm and is consistent with a Baker's cyst. IMPRESSION: 1. No evidence of deep vein thrombosis in either lower extremity. 2. Baker's cyst of left popliteal fossa measuring up to 4 cm in maximum diameter. Electronically Signed   By: Aletta Edouard M.D.   On: 06/03/2022 13:52   CT Renal Stone Study  Result Date: 06/18/2022 CLINICAL DATA:  Bladder neck obstruction by report. * Tracking Code: BO * EXAM: CT ABDOMEN AND PELVIS WITHOUT CONTRAST TECHNIQUE: Multidetector CT imaging of the abdomen and pelvis was performed following the standard protocol without IV contrast. RADIATION DOSE REDUCTION: This exam was performed according to the departmental dose-optimization program which includes automated exposure control, adjustment of the mA and/or kV according to patient size and/or use of iterative reconstruction technique. COMPARISON:  April 23, 2022 FINDINGS: Lower chest: Small bilateral pleural effusions. Basilar atelectasis. No dense consolidation. Heart size is enlarged with extensive coronary artery calcification. Low-attenuation cardiac blood pools is likely indicative of anemia. Hepatobiliary: Smooth hepatic contours. No pericholecystic stranding. No gross  biliary duct distension. No visible lesion on noncontrast imaging. Cholelithiasis. Pancreas: Pancreas normal contours, no signs of inflammation or peripancreatic fluid. Spleen: Normal. Adrenals/Urinary Tract: Adrenal glands are normal. Perinephric stranding bilaterally without perinephric fluid. Generalized increase in body wall edema signs of mild anasarca makes this finding nonspecific. There is no hydronephrosis. Adrenal glands are normal. Urinary bladder partially collapsed with Foley catheter in-situ. No focal perinephric stranding. Stomach/Bowel: Stomach without signs of acute process. Mildly distended fluid-filled distal small bowel loops. No signs of bowel obstruction. Normal appendix. No pericolonic stranding. Vascular/Lymphatic: Calcified aortic atherosclerosis without aneurysm. Small lymph nodes in the retroperitoneum and pelvis better delineated on recent CT. Reproductive: Prostate not well assessed on CT. Prostate is not enlarged substantially Other: Small volume free fluid in the pelvis is nonspecific and perhaps minimally increased from previous imaging. No pneumoperitoneum. Musculoskeletal: No acute bone finding. Lucent area in RIGHT posterior eighth rib at the costovertebral angle with mild expansion of the rib is of uncertain significance. Mild increased radio tracer accumulation in this location is noted on previous imaging. Spinal degenerative changes. Body wall edema slightly increased compared to previous imaging. IMPRESSION: 1. Bilateral perinephric stranding without hydronephrosis. Findings could reflect sequela of renal dysfunction or be related to slight increase in generalized findings of mild anasarca. Would also correlate with urinalysis to exclude urinary  tract infection. 2. Small bilateral pleural effusions with bibasilar atelectasis. 3. Query benign findings of process such as fibrous dysplasia versus is monostotic metastatic process in the RIGHT posterior eighth rib. Correlation with  PSA and attention on follow-up is suggested. 4. Small retroperitoneal lymph nodes and pelvic lymph nodes better seen on contrasted CT, please refer to previous imaging for oncologic details. 5. Cholelithiasis. 6. Aortic atherosclerosis. Aortic Atherosclerosis (ICD10-I70.0). Electronically Signed   By: Zetta Bills M.D.   On: 06/20/2022 11:55   DG Chest Portable 1 View  Result Date: 05/28/2022 CLINICAL DATA:  Bilateral leg swelling EXAM: PORTABLE CHEST 1 VIEW COMPARISON:  None Available. FINDINGS: The cardiomediastinal silhouette is within normal limits in size. There is no focal airspace consolidation. There is no pleural effusion. No pneumothorax. There is no acute osseous abnormality. Bilateral glenohumeral osteoarthritis with ossified joint bodies on the right. Thoracic spondylosis.  Defibrillator pads overlie the chest. IMPRESSION: No evidence of acute cardiopulmonary disease. Electronically Signed   By: Maurine Simmering M.D.   On: 05/31/2022 10:18    Scheduled Meds:  aspirin EC  81 mg Oral Daily   finasteride  5 mg Oral QPM   heparin  5,000 Units Subcutaneous Q8H   rosuvastatin  20 mg Oral QHS   tamsulosin  0.4 mg Oral Daily   cyanocobalamin  100 mcg Oral Daily   Continuous Infusions:   LOS: 1 day   Shelly Coss, MD [facility] P9/13/2023, 1:24 PM

## 2022-06-05 NOTE — Progress Notes (Signed)
Noted on telemetry HR fluctuating from 30s-40s. RN went to pt room when HR 30s and pt sleeping in bed. Woke pt easily. Denies dizziness, SOB/CP. Says his HR has "always been in the 40s - for years". BP stable. EKG obtained and night MD notified. Per MD notify if HR sustains in 80s.   Later when pt stood at bedside, HR up to 50s. No dizziness when standing.

## 2022-06-05 NOTE — Telephone Encounter (Signed)
Appt adjusted to discuss stopping firmagon

## 2022-06-06 ENCOUNTER — Inpatient Hospital Stay (HOSPITAL_COMMUNITY)
Admit: 2022-06-06 | Discharge: 2022-06-06 | Disposition: A | Discharge status: Home / Self Care | Payer: Medicare Other | Attending: Cardiovascular Disease | Admitting: Cardiovascular Disease

## 2022-06-06 DIAGNOSIS — N138 Other obstructive and reflux uropathy: Secondary | ICD-10-CM

## 2022-06-06 DIAGNOSIS — I10 Essential (primary) hypertension: Secondary | ICD-10-CM

## 2022-06-06 DIAGNOSIS — N1832 Chronic kidney disease, stage 3b: Secondary | ICD-10-CM

## 2022-06-06 DIAGNOSIS — I5033 Acute on chronic diastolic (congestive) heart failure: Secondary | ICD-10-CM

## 2022-06-06 DIAGNOSIS — N139 Obstructive and reflux uropathy, unspecified: Secondary | ICD-10-CM

## 2022-06-06 DIAGNOSIS — E876 Hypokalemia: Secondary | ICD-10-CM

## 2022-06-06 DIAGNOSIS — N401 Enlarged prostate with lower urinary tract symptoms: Secondary | ICD-10-CM

## 2022-06-06 DIAGNOSIS — I5031 Acute diastolic (congestive) heart failure: Secondary | ICD-10-CM

## 2022-06-06 DIAGNOSIS — N179 Acute kidney failure, unspecified: Secondary | ICD-10-CM | POA: Diagnosis not present

## 2022-06-06 DIAGNOSIS — D631 Anemia in chronic kidney disease: Secondary | ICD-10-CM

## 2022-06-06 LAB — ECHOCARDIOGRAM COMPLETE
AR max vel: 1.85 cm2
AV Area VTI: 2.43 cm2
AV Area mean vel: 1.64 cm2
AV Mean grad: 3 mmHg
AV Peak grad: 5.5 mmHg
Ao pk vel: 1.17 m/s
Area-P 1/2: 3.46 cm2
Height: 68 in
Weight: 2332.8 oz

## 2022-06-06 LAB — OCCULT BLOOD X 1 CARD TO LAB, STOOL: Fecal Occult Bld: NEGATIVE

## 2022-06-06 LAB — BASIC METABOLIC PANEL
Anion gap: 12 (ref 5–15)
BUN: 87 mg/dL — ABNORMAL HIGH (ref 8–23)
CO2: 22 mmol/L (ref 22–32)
Calcium: 8.6 mg/dL — ABNORMAL LOW (ref 8.9–10.3)
Chloride: 89 mmol/L — ABNORMAL LOW (ref 98–111)
Creatinine, Ser: 5.01 mg/dL — ABNORMAL HIGH (ref 0.61–1.24)
GFR, Estimated: 10 mL/min — ABNORMAL LOW (ref >60)
Glucose, Bld: 118 mg/dL — ABNORMAL HIGH (ref 70–99)
Potassium: 4.1 mmol/L (ref 3.5–5.1)
Sodium: 123 mmol/L — ABNORMAL LOW (ref 135–145)

## 2022-06-06 LAB — CBC
HCT: 23.8 % — ABNORMAL LOW (ref 39.0–52.0)
Hemoglobin: 8.3 g/dL — ABNORMAL LOW (ref 13.0–17.0)
MCH: 31.4 pg (ref 26.0–34.0)
MCHC: 34.9 g/dL (ref 30.0–36.0)
MCV: 90.2 fL (ref 80.0–100.0)
Platelets: 176 10*3/uL (ref 150–400)
RBC: 2.64 MIL/uL — ABNORMAL LOW (ref 4.22–5.81)
RDW: 16 % — ABNORMAL HIGH (ref 11.5–15.5)
WBC: 7 10*3/uL (ref 4.0–10.5)
nRBC: 0 % (ref 0.0–0.2)

## 2022-06-06 MED ORDER — SODIUM CHLORIDE 0.9 % IV SOLN
510.0000 mg | Freq: Once | INTRAVENOUS | Status: AC
  Administered 2022-06-06: 510 mg via INTRAVENOUS
  Filled 2022-06-06: qty 17

## 2022-06-06 MED ORDER — CHLORHEXIDINE GLUCONATE CLOTH 2 % EX PADS
6.0000 | MEDICATED_PAD | Freq: Every day | CUTANEOUS | Status: DC
  Administered 2022-06-06 – 2022-06-11 (×5): 6 via TOPICAL

## 2022-06-06 MED ORDER — POLYETHYLENE GLYCOL 3350 17 G PO PACK
17.0000 g | PACK | Freq: Every day | ORAL | Status: DC
  Administered 2022-06-06 – 2022-06-10 (×2): 17 g via ORAL
  Filled 2022-06-06 (×4): qty 1

## 2022-06-06 NOTE — Evaluation (Signed)
Occupational Therapy Evaluation Patient Details Name: Aaron Grimes MRN: [id number] DOB: 02-04-1930 Today's Date: 06/06/2022   History of Present Illness Patient is a 88 year old male with history of neuropathy, hypertension, hyperlipidemia, BPH, diastolic CHF,  prostate cancer who presented after his PCP referred him for the evaluation of abnormal labs.   Clinical Impression   Patient presenting with decreased independence in self-care, functional mobility, and safety. Pt in recliner requesting to get up, with family present. Patient currently functioning at supervision for transfer sit <> stand using RW, and grooming tasks (oral care, shaving, washing face). Patient will benefit from acute OT to increase overall independence in the areas of ADLs, functional mobility,  in order to safely discharge home.       Recommendations for follow up therapy are one component of a multi-disciplinary discharge planning process, led by the attending physician.  Recommendations may be updated based on patient status, additional functional criteria and insurance authorization.   Follow Up Recommendations  No OT follow up    Assistance Recommended at Discharge Intermittent Supervision/Assistance  Patient can return home with the following A little help with walking and/or transfers;A little help with bathing/dressing/bathroom;Assistance with cooking/housework    Functional Status Assessment  Patient has had a recent decline in their functional status and demonstrates the ability to make significant improvements in function in a reasonable and predictable amount of time.  Equipment Recommendations  None recommended by OT       Precautions / Restrictions Precautions Precautions: Fall Restrictions Weight Bearing Restrictions: No      Mobility Bed Mobility               General bed mobility comments: Pt in recliner upon arrival.    Transfers Overall transfer level: Needs  assistance Equipment used: Rolling walker (2 wheels) Transfers: Sit to/from Stand Sit to Stand: Supervision                  Balance Overall balance assessment: Needs assistance Sitting-balance support: Feet supported Sitting balance-Leahy Scale: Good     Standing balance support: Reliant on assistive device for balance, Bilateral upper extremity supported Standing balance-Leahy Scale: Fair                             ADL either performed or assessed with clinical judgement   ADL Overall ADL's : Needs assistance/impaired     Grooming: Wash/dry face;Supervision/safety Grooming Details (indicate cue type and reason): Supervision for grooming task of shaving and washing face.                             Functional mobility during ADLs: Supervision/safety        Pertinent Vitals/Pain Pain Assessment Pain Assessment: No/denies pain     Hand Dominance Right   Extremity/Trunk Assessment Upper Extremity Assessment Upper Extremity Assessment: Overall WFL for tasks assessed   Lower Extremity Assessment Lower Extremity Assessment: Generalized weakness   Cervical / Trunk Assessment Cervical / Trunk Assessment: Normal   Communication Communication Communication: No difficulties   Cognition Arousal/Alertness: Awake/alert Behavior During Therapy: WFL for tasks assessed/performed Overall Cognitive Status: Within Functional Limits for tasks assessed  Home Living Family/patient expects to be discharged to:: Private residence Living Arrangements: Spouse/significant other Available Help at Discharge: Family Type of Home: House Home Access: Stairs to enter [employer] of Steps: 6" step from garage Entrance Stairs-Rails: Right Home Layout: Two level;Able to live on main level with bedroom/bathroom;Laundry or work area in Building surveyor of  Steps: 14 Alternate Level Stairs-Rails: Right Bathroom Shower/Tub: Occupational psychologist: Handicapped height Bathroom Accessibility: Yes   Home Equipment: Grab bars - toilet;Grab bars - tub/shower;Rolling Environmental consultant (2 wheels);BSC/3in1;Rollator (4 wheels);Shower seat;Cane - single point;Wheelchair - manual   Additional Comments: equipment belongs to his wife; [facility] is the patients      Prior Functioning/Environment Prior Level of Function : Independent/Modified Independent;Driving             Mobility Comments: SPC for mobility, very active          OT Problem List: Decreased strength;Decreased activity tolerance;Decreased safety awareness      OT Treatment/Interventions: Self-care/ADL training;Therapeutic exercise;Patient/family education;Therapeutic activities;DME and/or AE instruction    OT Goals(Current goals can be found in the care plan section) Acute Rehab OT Goals Patient Stated Goal: to retun home. OT Goal Formulation: With patient/family Time For Goal Achievement: 06/06/22 Potential to Achieve Goals: Good ADL Goals Pt Will Perform Lower Body Bathing: with modified independence Pt Will Perform Lower Body Dressing: with modified independence Pt Will Transfer to Toilet: with modified independence Pt Will Perform Toileting - Clothing Manipulation and hygiene: with modified independence  OT Frequency: Min 2X/week       AM-PAC OT "6 Clicks" Daily Activity     Outcome Measure Help from another person eating meals?: None Help from another person taking care of personal grooming?: A Little Help from another person toileting, which includes using toliet, bedpan, or urinal?: A Little Help from another person bathing (including washing, rinsing, drying)?: A Little Help from another person to put on and taking off regular upper body clothing?: None Help from another person to put on and taking off regular lower body clothing?: A Little 6 Click Score: 20   End  of Session Equipment Utilized During Treatment: Rolling walker (2 wheels) Nurse Communication: Mobility status;Other (comment) (Pt requested to not have chair alarm set while visitor in room. Nurse informed.)  Activity Tolerance: Patient tolerated treatment well Patient left: in chair;with nursing/sitter in room;with call bell/phone within reach  OT Visit Diagnosis: Muscle weakness (generalized) (M62.81)                Time: 2703-5009 OT Time Calculation (min): 22 min Charges:  OT General Charges $OT Visit: 1 Visit OT Evaluation $OT Eval Low Complexity: 1 Low OT Treatments $Self Care/Home Management : 8-22 mins    Myleigh Amara, OTS 06/06/2022, 1:35 PM

## 2022-06-06 NOTE — Progress Notes (Signed)
*  PRELIMINARY RESULTS* Echocardiogram 2D Echocardiogram has been performed.  Sherrie Sport 06/06/2022, 1:51 PM

## 2022-06-06 NOTE — Progress Notes (Signed)
PROGRESS NOTE  Aaron Grimes  NWG:[id number] DOB: 08-06-1930 DOA: 05/28/2022 PCP: Lenard Simmer, MD   Brief Narrative:  Patient is a 88 year old male with history of neuropathy, hypertension, hyperlipidemia, BPH, prostate cancer who presented after his PCP referred him for the evaluation of abnormal labs.  On presentation ,he was hemodynamically stable.  Lab work showed sodium of 126, potassium of 3, creatinine of 4.65.  BNP was elevated.  Patient reported worsening shortness of breath, lower extremity swelling, decreased urine output for the last 2 weeks.  CT renal study showed bilateral perinephric stranding without hydronephrosis .urine retention was suspected and Foley was placed.  Nephrology consulted for severe worsening AKI  Assessment & Plan:  Principal Problem:   AKI (acute kidney injury) ([place]) Active Problems:   BPH with obstruction/lower urinary tract symptoms   Hypokalemia   Essential hypertension   Heart failure with preserved ejection fraction (HCC)   Iron deficiency anemia   AKI: Baseline creatinine normal.  Creatinine was 3.1 on 8/23 which was done as an outpatient.  Patient referred here by his PCP. History of BPH/prostate cancer.  Also taking torsemide at home and not having good urine  output. CT renal study showed bilateral perinephric stranding without hydronephrosis .urine retention was suspected and Foley was placed.  Nephrology consulted and following. Patient looked volume overloaded on presentation, with elevated BNP.  Bilateral lower extremity edema has improved today.  Lungs are clear on auscultation.  Hypertension: Home lisinopril on hold due to AKI.  , continue prn Medications for severe hypertension.  Currently blood pressure soft  Hypokalemia: Supplemented with potassium, monitor  History of hyperlipidemia: Takes rosuvastatin  History of carotid artery disease: On aspirin at home, now on hold  Normocytic anemia: No evidence of acute blood  loss.  Hemoglobin currently in the range of 7-8.  Hemoglobin was 10.4 on 10/23/2021.  Patient taking iron pill.  He reports black stools but attributes that to iron pills.  Iron studies showed low iron, given a dose of IV iron.  He denies any frank hematochezia or melena.  We will check FOBT.  Severe bilateral lower extremity more on the right: Likely from volume overload.  Venous Doppler did not show any DVT.  Edema improved today  Hyponatremia: This is most likely from volume overload.  Continue to monitor.  History of diastolic congestive heart failure: Follows with cardiology.  Echo done on 03/19/2022 showed EF of 60 to 57%, grade 2 diastolic dysfunction.  Currently appears well overloaded, on torsemide  at home.elevated BNP.  Cardio  following here, ordered new echo  Elevated troponin: This is likely from demand ischemia.  Troponin significantly high with flat trend.  Denies any chest pain.  Cardiology following  History BPH/prostate cancer: Continue finasteride.  He follows with urology.  Bone scan on 8/23 did not show any skeletal metastatic disease.  CT abdomen/pelvis done on 04/23/2022 showed small to borderline enlarged abdominal/pelvic retroperitoneal lymph nodes indicative of metastatic disease.  Urology planning to start on ADT but he wants to wait.  He does not want referral to the cancer center.  Recommend outpatient follow-up with urology  Debility/deconditioning: Patient looks very weak, deconditioned.  PT/OT recommendation is home health      DVT prophylaxis:heparin injection 5,000 Units Start: 06/19/2022 2200 Place TED hose Start: 06/20/2022 1216     Code Status: DNR  Family Communication: Daughter at bedside on 9/14  Patient status:Inpatient  Patient is from :Home  Anticipated discharge QI:ONGE  Estimated DC date:not sure  at the moment   Consultants: Nephrology  Procedures:None  Antimicrobials:  Anti-infectives (From admission, onward)    None        Subjective:  Patient seen and examined at the bedside today.  Looks more comfortable than yesterday.  Sitting in the chair.  Not in any kind of respiratory distress.  Denies any shortness of breath or cough.  Leg edema have improved Objective: Vitals:   06/06/22 0001 06/06/22 0453 06/06/22 0456 06/06/22 1028  BP: (!) 91/41 (!) 94/44 (!) 101/45 (!) 111/48  Pulse: 78 (!) 46  (!) 40  Resp: '18 16 18   '$ Temp: 97.9 F (36.6 C) 97.8 F (36.6 C)    TempSrc: Oral Oral    SpO2: 93% 94%  99%  Weight:      Height:        Intake/Output Summary (Last 24 hours) at 06/06/2022 1116 Last data filed at 06/06/2022 0617 Gross per 24 hour  Intake 480 ml  Output 450 ml  Net 30 ml   Filed Weights   06/18/2022 0928 05/28/2022 1400  Weight: 66.7 kg 66.1 kg    Examination:  General exam: Overall comfortable, not in distress HEENT: PERRL Respiratory system:  no wheezes or crackles  Cardiovascular system: S1 & S2 heard, RRR.  Gastrointestinal system: Abdomen is nondistended, soft and nontender. Central nervous system: Alert and oriented Extremities: Bilateral  lower extremity edema, more on the right, no clubbing ,no cyanosis Skin: No rashes, no ulcers,no icterus   GU: Foley with clear urine   Data Reviewed: I have personally reviewed following labs and imaging studies  CBC: Recent Labs  Lab 05/28/2022 0945 06/05/22 0625 06/06/22 0536  WBC 7.6 5.5 7.0  NEUTROABS 5.9  --   --   HGB 8.6* 7.8* 8.3*  HCT 25.1* 22.1* 23.8*  MCV 92.3 90.6 90.2  PLT 185 161 379   Basic Metabolic Panel: Recent Labs  Lab 05/28/2022 0945 06/05/22 0625 06/06/22 0536  NA 126* 126* 123*  K 3.0* 4.1 4.1  CL 88* 92* 89*  CO2 23 21* 22  GLUCOSE 124* 119* 118*  BUN 79* 81* 87*  CREATININE 4.65* 4.81* 5.01*  CALCIUM 8.9 8.6* 8.6*  MG 2.5*  --   --      Recent Results (from the past 240 hour(s))  Urine Culture     Status: None   Collection Time: 06/22/2022 12:14 PM   Specimen: Urine, Clean Catch  Result  Value Ref Range Status   Specimen Description   Final    URINE, CLEAN CATCH Performed at [facility], [address], [place], [place] [address]    Special Requests   Final    NONE Performed at [facility], [address], [place], [place] [address]    Culture   Final    NO GROWTH Performed at [facility], [place] [address] [place], [place] [address]    Report Status 06/05/2022 FINAL  Final     Radiology Studies: US Venous Img Lower Bilateral  Result Date: 06/15/2022 CLINICAL DATA:  Bilateral lower extremity edema. EXAM: BILATERAL LOWER EXTREMITY VENOUS DOPPLER ULTRASOUND TECHNIQUE: Gray-scale sonography with graded compression, as well as color Doppler and duplex ultrasound were performed to evaluate the lower extremity deep venous systems from the level of the common femoral vein and including the common femoral, femoral, profunda femoral, popliteal and calf veins including the posterior tibial, peroneal and gastrocnemius veins when visible. The superficial great saphenous vein was also interrogated. Spectral Doppler  was utilized to evaluate flow at rest and with distal augmentation maneuvers in the common femoral, femoral and popliteal veins. COMPARISON:  None Available. FINDINGS: RIGHT LOWER EXTREMITY Common Femoral Vein: No evidence of thrombus. Normal compressibility, respiratory phasicity and response to augmentation. Saphenofemoral Junction: No evidence of thrombus. Normal compressibility and flow on color Doppler imaging. Profunda Femoral Vein: No evidence of thrombus. Normal compressibility and flow on color Doppler imaging. Femoral Vein: No evidence of thrombus. Normal compressibility, respiratory phasicity and response to augmentation. Popliteal Vein: No evidence of thrombus. Normal compressibility, respiratory phasicity and response to augmentation. Calf Veins: No evidence of thrombus. Normal compressibility and flow on color Doppler imaging. Superficial  Great Saphenous Vein: No evidence of thrombus. Normal compressibility. Venous Reflux:  None. Other Findings: No evidence of superficial thrombophlebitis or abnormal fluid collection. LEFT LOWER EXTREMITY Common Femoral Vein: No evidence of thrombus. Normal compressibility, respiratory phasicity and response to augmentation. Saphenofemoral Junction: No evidence of thrombus. Normal compressibility and flow on color Doppler imaging. Profunda Femoral Vein: No evidence of thrombus. Normal compressibility and flow on color Doppler imaging. Femoral Vein: No evidence of thrombus. Normal compressibility, respiratory phasicity and response to augmentation. Popliteal Vein: No evidence of thrombus. Normal compressibility, respiratory phasicity and response to augmentation. Calf Veins: No evidence of thrombus. Normal compressibility and flow on color Doppler imaging. Superficial Great Saphenous Vein: No evidence of thrombus. Normal compressibility. Venous Reflux:  None. Other Findings: No evidence of superficial thrombophlebitis. Popliteal fossa cyst on the left measures roughly 4 x 1.4 x 3 cm and is consistent with a Baker's cyst. IMPRESSION: 1. No evidence of deep vein thrombosis in either lower extremity. 2. Baker's cyst of left popliteal fossa measuring up to 4 cm in maximum diameter. Electronically Signed   By: Aletta Edouard M.D.   On: 06/16/2022 13:52   CT Renal Stone Study  Result Date: 06/09/2022 CLINICAL DATA:  Bladder neck obstruction by report. * Tracking Code: BO * EXAM: CT ABDOMEN AND PELVIS WITHOUT CONTRAST TECHNIQUE: Multidetector CT imaging of the abdomen and pelvis was performed following the standard protocol without IV contrast. RADIATION DOSE REDUCTION: This exam was performed according to the departmental dose-optimization program which includes automated exposure control, adjustment of the mA and/or kV according to patient size and/or use of iterative reconstruction technique. COMPARISON:  April 23, 2022 FINDINGS: Lower chest: Small bilateral pleural effusions. Basilar atelectasis. No dense consolidation. Heart size is enlarged with extensive coronary artery calcification. Low-attenuation cardiac blood pools is likely indicative of anemia. Hepatobiliary: Smooth hepatic contours. No pericholecystic stranding. No gross biliary duct distension. No visible lesion on noncontrast imaging. Cholelithiasis. Pancreas: Pancreas normal contours, no signs of inflammation or peripancreatic fluid. Spleen: Normal. Adrenals/Urinary Tract: Adrenal glands are normal. Perinephric stranding bilaterally without perinephric fluid. Generalized increase in body wall edema signs of mild anasarca makes this finding nonspecific. There is no hydronephrosis. Adrenal glands are normal. Urinary bladder partially collapsed with Foley catheter in-situ. No focal perinephric stranding. Stomach/Bowel: Stomach without signs of acute process. Mildly distended fluid-filled distal small bowel loops. No signs of bowel obstruction. Normal appendix. No pericolonic stranding. Vascular/Lymphatic: Calcified aortic atherosclerosis without aneurysm. Small lymph nodes in the retroperitoneum and pelvis better delineated on recent CT. Reproductive: Prostate not well assessed on CT. Prostate is not enlarged substantially Other: Small volume free fluid in the pelvis is nonspecific and perhaps minimally increased from previous imaging. No pneumoperitoneum. Musculoskeletal: No acute bone finding. Lucent area in RIGHT posterior eighth rib at the costovertebral angle with mild  expansion of the rib is of uncertain significance. Mild increased radio tracer accumulation in this location is noted on previous imaging. Spinal degenerative changes. Body wall edema slightly increased compared to previous imaging. IMPRESSION: 1. Bilateral perinephric stranding without hydronephrosis. Findings could reflect sequela of renal dysfunction or be related to slight increase in  generalized findings of mild anasarca. Would also correlate with urinalysis to exclude urinary tract infection. 2. Small bilateral pleural effusions with bibasilar atelectasis. 3. Query benign findings of process such as fibrous dysplasia versus is monostotic metastatic process in the RIGHT posterior eighth rib. Correlation with PSA and attention on follow-up is suggested. 4. Small retroperitoneal lymph nodes and pelvic lymph nodes better seen on contrasted CT, please refer to previous imaging for oncologic details. 5. Cholelithiasis. 6. Aortic atherosclerosis. Aortic Atherosclerosis (ICD10-I70.0). Electronically Signed   By: Zetta Bills M.D.   On: 06/22/2022 11:55    Scheduled Meds:  Chlorhexidine Gluconate Cloth  6 each Topical Daily   finasteride  5 mg Oral QPM   heparin  5,000 Units Subcutaneous Q8H   rosuvastatin  20 mg Oral QHS   cyanocobalamin  100 mcg Oral Daily   Continuous Infusions:   LOS: 2 days   Shelly Coss, MD [facility] P9/14/2023, 11:16 AM

## 2022-06-06 NOTE — Progress Notes (Signed)
Thigh high stockings put on per nephrologist.  Pt tolerated well.

## 2022-06-06 NOTE — Progress Notes (Signed)
[place], [place] 06/06/22  Subjective:   Hospital day # 2  Patient clinically feels slightly better today.  Feels like his lower extremity edema is not as much.  He has a Foley catheter in place.  Urine output also appears to be improving.  Unfortunately creatinine is higher today.   Renal: 09/13 0701 - 09/14 0700 In: 720 [P.O.:720] Out: 625 [Urine:625] Lab Results  Component Value Date   CREATININE 5.01 (H) 06/06/2022   CREATININE 4.81 (H) 06/05/2022   CREATININE 4.65 (H) 06/06/2022     Objective:  Vital signs in last 24 hours:  Temp:  [97.5 F (36.4 C)-98 F (36.7 C)] 97.7 F (36.5 C) (09/14 1236) Pulse Rate:  [40-78] 78 (09/14 1236) Resp:  [16-20] 20 (09/14 1236) BP: (91-131)/(41-67) 130/57 (09/14 1236) SpO2:  [90 %-100 %] 100 % (09/14 1236)  Weight change:  Filed Weights   06/06/2022 0928 06/14/2022 1400  Weight: 66.7 kg 66.1 kg    Intake/Output:    Intake/Output Summary (Last 24 hours) at 06/06/2022 1625 Last data filed at 06/06/2022 1551 Gross per 24 hour  Intake 597 ml  Output 450 ml  Net 147 ml     Physical Exam: General: Sitting up in the recliner chair  HEENT Anicteric, moist oral mucous membranes  Pulm/lungs Normal breathing effort, clear to auscultation  CVS/Heart Regular, no rub  Abdomen:  Soft, nontender, nondistended  Extremities: Right greater than left 2-3+ pitting edema  Neurologic: Alert, oriented  Skin: No acute rashes   Foley in place       Basic Metabolic Panel:  Recent Labs  Lab 06/13/2022 0945 06/05/22 0625 06/06/22 0536  NA 126* 126* 123*  K 3.0* 4.1 4.1  CL 88* 92* 89*  CO2 23 21* 22  GLUCOSE 124* 119* 118*  BUN 79* 81* 87*  CREATININE 4.65* 4.81* 5.01*  CALCIUM 8.9 8.6* 8.6*  MG 2.5*  --   --      CBC: Recent Labs  Lab 06/13/2022 0945 06/05/22 0625 06/06/22 0536  WBC 7.6 5.5 7.0  NEUTROABS 5.9  --   --   HGB 8.6* 7.8* 8.3*  HCT 25.1* 22.1* 23.8*  MCV 92.3 90.6 90.2  PLT 185  161 176     No results found for: "HEPBSAG", "HEPBSAB", "HEPBIGM"    Microbiology:  Recent Results (from the past 240 hour(s))  Urine Culture     Status: None   Collection Time: 06/09/2022 12:14 PM   Specimen: Urine, Clean Catch  Result Value Ref Range Status   Specimen Description   Final    URINE, CLEAN CATCH Performed at [facility], [address] [place], [place] [address]    Special Requests   Final    NONE Performed at [facility], [address], [place], [place] [address]    Culture   Final    NO GROWTH Performed at [facility], [place] [address], [place], [place] [address]    Report Status 06/05/2022 FINAL  Final    Coagulation Studies: No results for input(s): "LABPROT", "INR" in the last 72 hours.  Urinalysis: Recent Labs    05/24/2022 1214  [place] 1.006  PHURINE 5.0  GLUCOSEU NEGATIVE  HGBUR MODERATE*  BILIRUBINUR NEGATIVE  KETONESUR NEGATIVE  PROTEINUR 30*  NITRITE NEGATIVE  LEUKOCYTESUR NEGATIVE      Imaging: ECHOCARDIOGRAM COMPLETE  Result Date: 06/06/2022    ECHOCARDIOGRAM REPORT   Patient Name:   Aaron Grimes Date  of Exam: 06/06/2022 Medical Rec #:  [id number]             Height:       68.0 in Accession #:    [id number]            Weight:       145.8 lb Date of Birth:  1929/11/01            BSA:          1.787 m Patient Age:    88 years              BP:           111/48 mmHg Patient Gender: M                     HR:           40 bpm. Exam Location:  [facility] Procedure: 2D Echo, Cardiac Doppler and Color Doppler Indications:     CHF-acute diastolic J28.78  History:         Patient has no prior history of Echocardiogram examinations.                  Risk Factors:Dyslipidemia and Hypertension.  Sonographer:     Sherrie Sport Referring Phys:  Minna Merritts Diagnosing Phys: Ida Rogue MD  Sonographer Comments: Suboptimal apical window. IMPRESSIONS  1. Left ventricular ejection fraction, by  estimation, is 50 to 55%. The left ventricle has low normal function. The left ventricle has no regional wall motion abnormalities. There is moderate left ventricular hypertrophy. Left ventricular diastolic parameters are indeterminate.  2. Right ventricular systolic function is mildly reduced. The right ventricular size is moderately enlarged. There is normal pulmonary artery systolic pressure. The estimated right ventricular systolic pressure is 67.6 mmHg.  3. Left atrial size was mildly dilated.  4. Moderate pleural effusion in the left lateral region.  5. The mitral valve is normal in structure. Moderate mitral valve regurgitation. No evidence of mitral stenosis.  6. Tricuspid valve regurgitation is moderate.  7. The aortic valve is tricuspid. Aortic valve regurgitation is not visualized. Aortic valve sclerosis/calcification is present, without any evidence of aortic stenosis. Aortic valve mean gradient measures 3.0 mmHg.  8. The inferior vena cava is normal in size with greater than 50% respiratory variability, suggesting right atrial pressure of 3 mmHg.  9. Bradycardia, rate 44 bpm 10. Right atrial size was mildly dilated. FINDINGS  Left Ventricle: Left ventricular ejection fraction, by estimation, is 50 to 55%. The left ventricle has low normal function. The left ventricle has no regional wall motion abnormalities. The left ventricular internal cavity size was normal in size. There is moderate left ventricular hypertrophy. Left ventricular diastolic parameters are indeterminate. Right Ventricle: The right ventricular size is moderately enlarged. No increase in right ventricular wall thickness. Right ventricular systolic function is mildly reduced. There is normal pulmonary artery systolic pressure. The tricuspid regurgitant velocity is 2.23 m/s, and with an assumed right atrial pressure of 5 mmHg, the estimated right ventricular systolic pressure is 72.0 mmHg. Left Atrium: Left atrial size was mildly dilated.  Right Atrium: Right atrial size was mildly dilated. Pericardium: There is no evidence of pericardial effusion. Mitral Valve: The mitral valve is normal in structure. Moderate mitral valve regurgitation. No evidence of mitral valve stenosis. Tricuspid Valve: The tricuspid valve is normal in structure. Tricuspid valve regurgitation is moderate . No evidence of tricuspid stenosis. Aortic Valve: The aortic valve is tricuspid. Aortic  valve regurgitation is not visualized. Aortic valve sclerosis/calcification is present, without any evidence of aortic stenosis. Aortic valve mean gradient measures 3.0 mmHg. Aortic valve peak gradient measures 5.5 mmHg. Aortic valve area, by VTI measures 2.43 cm. Pulmonic Valve: The pulmonic valve was normal in structure. Pulmonic valve regurgitation is mild. No evidence of pulmonic stenosis. Aorta: The aortic root is normal in size and structure. Venous: The inferior vena cava is normal in size with greater than 50% respiratory variability, suggesting right atrial pressure of 3 mmHg. IAS/Shunts: No atrial level shunt detected by color flow Doppler. Additional Comments: There is a moderate pleural effusion in the left lateral region.  LEFT VENTRICLE PLAX 2D LVIDd:         3.10 cm   Diastology LV PW:         1.00 cm   LV e' medial: 8.38 cm/s LV IVS:        1.40 cm LVOT diam:     1.90 cm LV SV:         54 LV SV Index:   30 LVOT Area:     2.84 cm  LEFT ATRIUM           Index        RIGHT ATRIUM           Index LA diam:      4.40 cm 2.46 cm/m   RA Area:     20.00 cm LA Vol (A2C): 37.0 ml 20.71 ml/m  RA Volume:   63.40 ml  35.48 ml/m LA Vol (A4C): 50.4 ml 28.21 ml/m  AORTIC VALVE AV Area (Vmax):    1.85 cm AV Area (Vmean):   1.64 cm AV Area (VTI):     2.43 cm AV Vmax:           117.00 cm/s AV Vmean:          83.400 cm/s AV VTI:            0.224 m AV Peak Grad:      5.5 mmHg AV Mean Grad:      3.0 mmHg LVOT Vmax:         76.50 cm/s LVOT Vmean:        48.200 cm/s LVOT VTI:          0.192  m LVOT/AV VTI ratio: 0.86  AORTA Ao Root diam: 3.20 cm MITRAL VALVE             TRICUSPID VALVE MV Area (PHT): 3.46 cm  TR Peak grad:   19.9 mmHg MV Decel Time: 219 msec  TR Vmax:        223.00 cm/s                           SHUNTS                          Systemic VTI:  0.19 m                          Systemic Diam: 1.90 cm Ida Rogue MD Electronically signed by Ida Rogue MD Signature Date/Time: 06/06/2022/2:07:20 PM    Final      Medications:     Chlorhexidine Gluconate Cloth  6 each Topical Daily   finasteride  5 mg Oral QPM   heparin  5,000 Units Subcutaneous Q8H   polyethylene glycol  17 g  Oral Daily   rosuvastatin  20 mg Oral QHS   cyanocobalamin  100 mcg Oral Daily   acetaminophen **OR** acetaminophen, hydrALAZINE, ondansetron **OR** ondansetron (ZOFRAN) IV, mouth rinse, senna-docusate  Assessment/ Plan:  88 y.o. male with hypertension, BPH, hyperlipidemia, suspected prostate cancer   admitted on 05/26/2022 for AKI (acute kidney injury) ([place]) [N17.9]  #Acute kidney injury Unclear cause but likely secondary to aggressive diuresis.  Patient also had reported short-term nonsteroidal use.  Baseline creatinine of 1.5 from April 23, 2022.  No IV contrast exposure. Currently Foley catheter is in place and urine output appears to be improving. -Continue to monitor closely.  No uremic symptoms or acute indication for dialysis at present.  #Hyponatremia Secondary to volume overload.  Expected to improve as volume status corrects.  #Chronic diastolic CHF As per cardiology note, patient has grade 3 diastolic dysfunction. Diuretic on hold at present  #Lower extremity edema Edema appears to be improving some as patient is mobilizing.  We will continue to monitor closely.   LOS: 2 Olof Marcil Candiss Norse 9/14/20234:25 PM  [place], [place]  Note: This note was prepared with Dragon dictation. Any transcription errors are unintentional

## 2022-06-06 NOTE — Progress Notes (Addendum)
Rounding Note    Patient Name: Aaron Grimes Date of Encounter: 06/06/2022  [place] Cardiologist: None   Subjective   Kidney function worse today 4.81>5.01. Lower leg edema is better. Good urine output s/p foley cath.   Inpatient Medications    Scheduled Meds:  Chlorhexidine Gluconate Cloth  6 each Topical Daily   finasteride  5 mg Oral QPM   heparin  5,000 Units Subcutaneous Q8H   rosuvastatin  20 mg Oral QHS   cyanocobalamin  100 mcg Oral Daily   Continuous Infusions:  PRN Meds: acetaminophen **OR** acetaminophen, hydrALAZINE, ondansetron **OR** ondansetron (ZOFRAN) IV, mouth rinse, senna-docusate   Vital Signs    Vitals:   06/06/22 0001 06/06/22 0453 06/06/22 0456 06/06/22 1028  BP: (!) 91/41 (!) 94/44 (!) 101/45 (!) 111/48  Pulse: 78 (!) 46  (!) 40  Resp: '18 16 18   '$ Temp: 97.9 F (36.6 C) 97.8 F (36.6 C)    TempSrc: Oral Oral    SpO2: 93% 94%  99%  Weight:      Height:        Intake/Output Summary (Last 24 hours) at 06/06/2022 1041 Last data filed at 06/06/2022 0617 Gross per 24 hour  Intake 480 ml  Output 450 ml  Net 30 ml      05/28/2022    2:00 PM 05/25/2022    9:28 AM 04/09/2022   11:19 AM  Last 3 Weights  Weight (lbs) 145 lb 12.8 oz 147 lb 0.8 oz 147 lb  Weight (kg) 66.134 kg 66.7 kg 66.679 kg      Telemetry    SB, HR 20-40s, appears to be junctional rhthym - Personally Reviewed  ECG    No new - Personally Reviewed  Physical Exam   GEN: No acute distress.   Neck: No JVD Cardiac: bradycardia, RR, no murmurs, rubs, or gallops.  Respiratory: Clear to auscultation bilaterally. GI: Soft, nontender, non-distended  MS: +lower leg edema, L>R; No deformity. Neuro:  Nonfocal  Psych: Normal affect   Labs    High Sensitivity Troponin:   Recent Labs  Lab 06/21/2022 0945 06/19/2022 1147  TROPONINIHS 245* 239*     Chemistry Recent Labs  Lab 06/03/2022 0945 06/05/22 0625 06/06/22 0536  NA 126* 126* 123*  K 3.0* 4.1  4.1  CL 88* 92* 89*  CO2 23 21* 22  GLUCOSE 124* 119* 118*  BUN 79* 81* 87*  CREATININE 4.65* 4.81* 5.01*  CALCIUM 8.9 8.6* 8.6*  MG 2.5*  --   --   PROT 5.9*  --   --   ALBUMIN 3.0*  --   --   AST 80*  --   --   ALT 43  --   --   ALKPHOS 96  --   --   BILITOT 0.8  --   --   GFRNONAA 11* 11* 10*  ANIONGAP '15 13 12    '$ Lipids No results for input(s): "CHOL", "TRIG", "HDL", "LABVLDL", "LDLCALC", "CHOLHDL" in the last 168 hours.  Hematology Recent Labs  Lab 06/12/2022 0945 06/05/22 0625 06/06/22 0536  WBC 7.6 5.5 7.0  RBC 2.72* 2.44* 2.64*  HGB 8.6* 7.8* 8.3*  HCT 25.1* 22.1* 23.8*  MCV 92.3 90.6 90.2  MCH 31.6 32.0 31.4  MCHC 34.3 35.3 34.9  RDW 16.0* 15.9* 16.0*  PLT 185 161 176   Thyroid  Recent Labs  Lab 05/29/2022 0945  TSH 2.501    BNP Recent Labs  Lab 05/29/2022 0945  BNP  2,819.7*    DDimer No results for input(s): "DDIMER" in the last 168 hours.   Radiology    US Venous Img Lower Bilateral  Result Date: 06/01/2022 CLINICAL DATA:  Bilateral lower extremity edema. EXAM: BILATERAL LOWER EXTREMITY VENOUS DOPPLER ULTRASOUND TECHNIQUE: Gray-scale sonography with graded compression, as well as color Doppler and duplex ultrasound were performed to evaluate the lower extremity deep venous systems from the level of the common femoral vein and including the common femoral, femoral, profunda femoral, popliteal and calf veins including the posterior tibial, peroneal and gastrocnemius veins when visible. The superficial great saphenous vein was also interrogated. Spectral Doppler was utilized to evaluate flow at rest and with distal augmentation maneuvers in the common femoral, femoral and popliteal veins. COMPARISON:  None Available. FINDINGS: RIGHT LOWER EXTREMITY Common Femoral Vein: No evidence of thrombus. Normal compressibility, respiratory phasicity and response to augmentation. Saphenofemoral Junction: No evidence of thrombus. Normal compressibility and flow on color  Doppler imaging. Profunda Femoral Vein: No evidence of thrombus. Normal compressibility and flow on color Doppler imaging. Femoral Vein: No evidence of thrombus. Normal compressibility, respiratory phasicity and response to augmentation. Popliteal Vein: No evidence of thrombus. Normal compressibility, respiratory phasicity and response to augmentation. Calf Veins: No evidence of thrombus. Normal compressibility and flow on color Doppler imaging. Superficial Great Saphenous Vein: No evidence of thrombus. Normal compressibility. Venous Reflux:  None. Other Findings: No evidence of superficial thrombophlebitis or abnormal fluid collection. LEFT LOWER EXTREMITY Common Femoral Vein: No evidence of thrombus. Normal compressibility, respiratory phasicity and response to augmentation. Saphenofemoral Junction: No evidence of thrombus. Normal compressibility and flow on color Doppler imaging. Profunda Femoral Vein: No evidence of thrombus. Normal compressibility and flow on color Doppler imaging. Femoral Vein: No evidence of thrombus. Normal compressibility, respiratory phasicity and response to augmentation. Popliteal Vein: No evidence of thrombus. Normal compressibility, respiratory phasicity and response to augmentation. Calf Veins: No evidence of thrombus. Normal compressibility and flow on color Doppler imaging. Superficial Great Saphenous Vein: No evidence of thrombus. Normal compressibility. Venous Reflux:  None. Other Findings: No evidence of superficial thrombophlebitis. Popliteal fossa cyst on the left measures roughly 4 x 1.4 x 3 cm and is consistent with a Baker's cyst. IMPRESSION: 1. No evidence of deep vein thrombosis in either lower extremity. 2. Baker's cyst of left popliteal fossa measuring up to 4 cm in maximum diameter. Electronically Signed   By: Aletta Edouard M.D.   On: 05/28/2022 13:52   CT Renal Stone Study  Result Date: 06/20/2022 CLINICAL DATA:  Bladder neck obstruction by report. * Tracking  Code: BO * EXAM: CT ABDOMEN AND PELVIS WITHOUT CONTRAST TECHNIQUE: Multidetector CT imaging of the abdomen and pelvis was performed following the standard protocol without IV contrast. RADIATION DOSE REDUCTION: This exam was performed according to the departmental dose-optimization program which includes automated exposure control, adjustment of the mA and/or kV according to patient size and/or use of iterative reconstruction technique. COMPARISON:  April 23, 2022 FINDINGS: Lower chest: Small bilateral pleural effusions. Basilar atelectasis. No dense consolidation. Heart size is enlarged with extensive coronary artery calcification. Low-attenuation cardiac blood pools is likely indicative of anemia. Hepatobiliary: Smooth hepatic contours. No pericholecystic stranding. No gross biliary duct distension. No visible lesion on noncontrast imaging. Cholelithiasis. Pancreas: Pancreas normal contours, no signs of inflammation or peripancreatic fluid. Spleen: Normal. Adrenals/Urinary Tract: Adrenal glands are normal. Perinephric stranding bilaterally without perinephric fluid. Generalized increase in body wall edema signs of mild anasarca makes this finding nonspecific. There is no hydronephrosis.  Adrenal glands are normal. Urinary bladder partially collapsed with Foley catheter in-situ. No focal perinephric stranding. Stomach/Bowel: Stomach without signs of acute process. Mildly distended fluid-filled distal small bowel loops. No signs of bowel obstruction. Normal appendix. No pericolonic stranding. Vascular/Lymphatic: Calcified aortic atherosclerosis without aneurysm. Small lymph nodes in the retroperitoneum and pelvis better delineated on recent CT. Reproductive: Prostate not well assessed on CT. Prostate is not enlarged substantially Other: Small volume free fluid in the pelvis is nonspecific and perhaps minimally increased from previous imaging. No pneumoperitoneum. Musculoskeletal: No acute bone finding. Lucent area in  RIGHT posterior eighth rib at the costovertebral angle with mild expansion of the rib is of uncertain significance. Mild increased radio tracer accumulation in this location is noted on previous imaging. Spinal degenerative changes. Body wall edema slightly increased compared to previous imaging. IMPRESSION: 1. Bilateral perinephric stranding without hydronephrosis. Findings could reflect sequela of renal dysfunction or be related to slight increase in generalized findings of mild anasarca. Would also correlate with urinalysis to exclude urinary tract infection. 2. Small bilateral pleural effusions with bibasilar atelectasis. 3. Query benign findings of process such as fibrous dysplasia versus is monostotic metastatic process in the RIGHT posterior eighth rib. Correlation with PSA and attention on follow-up is suggested. 4. Small retroperitoneal lymph nodes and pelvic lymph nodes better seen on contrasted CT, please refer to previous imaging for oncologic details. 5. Cholelithiasis. 6. Aortic atherosclerosis. Aortic Atherosclerosis (ICD10-I70.0). Electronically Signed   By: Zetta Bills M.D.   On: 06/22/2022 11:55    Cardiac Studies   Echo 02/2022 Summary    1. The left ventricle is normal in size with mildly increased wall  thickness.    2. The left ventricular systolic function is normal, LVEF is visually  estimated at 60-65%.    3. There is grade III diastolic dysfunction (severely elevated filling  pressure).    4. There is mild mitral valve regurgitation.    5. The left atrium is mildly dilated in size.    6. The aortic valve is trileaflet with mildly thickened leaflets with normal  excursion.    7. The right ventricle is upper normal in size, with normal systolic  function.    8. There is mild pulmonary hypertension.    9. Estimated pulmonary arterial systolic pressure is 49 mmHg.    10. The right atrium is mildly dilated  in size.    11. IVC size and inspiratory change suggest elevated  right atrial pressure.  (10-20 mmHg).    12. Bilateral pleural effusions.   Myoview Lexsican 01/2022 IMPRESSIONS:  __________________________________________________________________  - Normal myocardial perfusion study  - No evidence of any significant ischemia or scar  - Left ventricular systolic function is normal. Post stress the ejection  fraction is > 60%.  - Mild coronary calcifications are noted  - Incidentally noted on the attenuation CT scan mesenteric calcifications  and gallstones   Patient Profile     88 y.o. male with a h/o HFpEF, BPH and prostate cancer who is being seen for lower leg edema.   Assessment & Plan    HFpEF/Leg edema - PTA he was on and off Torsemide - Diuretics held with worsening kidney function - Echo 02/2022 showed LVEF 60-65%, G3DD, mild MR, mild pulmonary hypertension, mildly dilated RA - continue to hold diuretics and trend kidney function. Suspected worsening kidney function from obstruction.   Elevated troponin - no chest pain reported - suspected demand ischemia - Myoview Lexiscan showed no ischemia or scar,  mild coronary calcifications are noted - not a candidate for invasive procedure given anemia and kidney dysfunction - ASA held for anemia - continue Lipitor  Iron deficiency anemia - ASA held - Fecal occult ordered - Hgb 7-8  AKI - obstructive uropathy 2/2 BPH and prostate cancer - s/p foley with significant output - Creatinine continues to worsen - nephrology following  Bradycardia - tele shows HR upper 20-40s persistently - appears to be in a junctional rhythm - Keep Mag>2 and K>4 - TSH wnl - not on rate limiting meds at baseline - will discuss plan with MD  For questions or updates, please contact [place] Please consult [email or web address] for contact info under        Signed, Travell Desaulniers Ninfa Meeker, PA-C  06/06/2022, 10:41 AM

## 2022-06-06 NOTE — Evaluation (Signed)
Physical Therapy Evaluation Patient Details Name: Aaron Grimes MRN: [id number] DOB: [birth year] Today's Date: 06/06/2022  History of Present Illness  Patient is a 88 year old male with history of neuropathy, hypertension, hyperlipidemia, BPH, diastolic CHF,  prostate cancer who presented after his PCP referred him for the evaluation of abnormal labs.   Clinical Impression  Pt alert, oriented x4, reported at baseline he is modI/I for ADLs, uses a SPC for ambulation. Per pt/family report house is very handicap accessible, safety bars every where (primarily for his wife). Denies falls but did endorse a few "stumbles" outside.   The patient was able to perform sit <> Stand with RW and supervision, cued for hand placement. He was able to ambulate ~118f with RW and CGA-supervision. 1-2 standing rest breaks led by PT due to pt SOB, to allow PT to look at spO2 and HR (95% on room air, and HR ranged in the 40s, occasionally into the 50s). No LOB or stumbling noted. Did attempt ambulation with single UE support, but improved safety currently with RW, pt agreeable to use.  Overall the patient demonstrated deficits (see "PT Problem List") that impede the patient's functional abilities, safety, and mobility and would benefit from skilled PT intervention. Recommendation at this time is HHPT with intermittent supervision/assistance to maximize safety and function.        Recommendations for follow up therapy are one component of a multi-disciplinary discharge planning process, led by the attending physician.  Recommendations may be updated based on patient status, additional functional criteria and insurance authorization.  Follow Up Recommendations Home health PT      Assistance Recommended at Discharge Intermittent Supervision/Assistance  Patient can return home with the following  Assist for transportation;Help with stairs or ramp for entrance;Assistance with cooking/housework    Equipment  Recommendations Rolling walker (2 wheels)  Recommendations for Other Services       Functional Status Assessment Patient has had a recent decline in their functional status and demonstrates the ability to make significant improvements in function in a reasonable and predictable amount of time.     Precautions / Restrictions Precautions Precautions: Fall Restrictions Weight Bearing Restrictions: No      Mobility  Bed Mobility               General bed mobility comments: pt up in recliner at start/end of session    Transfers Overall transfer level: Needs assistance Equipment used: Rolling walker (2 wheels) Transfers: Sit to/from Stand Sit to Stand: Supervision                Ambulation/Gait Ambulation/Gait assistance: Min guard, Supervision Gait Distance (Feet): 180 Feet Assistive device: Rolling walker (2 wheels)   Gait velocity: decreased     General Gait Details: did attempt ambulation with hand rail for RUE, but improved safety with RW, pt agreeable to use for now  Stairs            Wheelchair Mobility    Modified Rankin (Stroke Patients Only)       Balance Overall balance assessment: Needs assistance Sitting-balance support: Feet supported Sitting balance-Leahy Scale: Good     Standing balance support: Reliant on assistive device for balance Standing balance-Leahy Scale: Fair                               Pertinent Vitals/Pain Pain Assessment Pain Assessment: No/denies pain    Home Living Family/patient expects to be discharged to::  Private residence Living Arrangements: Spouse/significant other Available Help at Discharge: Family Type of Home: House Home Access: Stairs to enter Entrance Stairs-Rails: Right Entrance Stairs-Number of Steps: 6" step from garage Alternate Level Stairs-Number of Steps: Mildred: Two level;Able to live on main level with bedroom/bathroom;Laundry or work area in [place]: Grab bars - toilet;Grab bars - Statistician (2 wheels);BSC/3in1;Rollator (4 wheels);Shower seat;Cane - single point;Wheelchair - manual Additional Comments: equipment belongs to his wife; [facility] is the patients    Prior Function Prior Level of Function : Independent/Modified Independent;Driving             Mobility Comments: SPC for mobility, very active       Hand Dominance   Dominant Hand: Right    Extremity/Trunk Assessment   Upper Extremity Assessment Upper Extremity Assessment: Defer to OT evaluation    Lower Extremity Assessment Lower Extremity Assessment: Generalized weakness    Cervical / Trunk Assessment Cervical / Trunk Assessment: Normal  Communication   Communication: No difficulties  Cognition Arousal/Alertness: Awake/alert Behavior During Therapy: WFL for tasks assessed/performed Overall Cognitive Status: Within Functional Limits for tasks assessed                                          General Comments      Exercises     Assessment/Plan    PT Assessment Patient needs continued PT services  PT Problem List Decreased strength;Decreased mobility;Decreased range of motion;Decreased activity tolerance;Decreased balance;Decreased knowledge of use of DME       PT Treatment Interventions DME instruction;Therapeutic exercise;Gait training;Balance training;Stair training;Neuromuscular re-education;Functional mobility training;Therapeutic activities;Patient/family education    PT Goals (Current goals can be found in the Care Plan section)  Acute Rehab PT Goals Patient Stated Goal: to get back to normal PT Goal Formulation: With patient Time For Goal Achievement: 06/20/22 Potential to Achieve Goals: Good    Frequency Min 2X/week     Co-evaluation               AM-PAC PT "6 Clicks" Mobility  Outcome Measure Help needed turning from your back to your side while in a flat bed without using bedrails?:  None Help needed moving from lying on your back to sitting on the side of a flat bed without using bedrails?: None Help needed moving to and from a bed to a chair (including a wheelchair)?: None Help needed standing up from a chair using your arms (e.g., wheelchair or bedside chair)?: None Help needed to walk in hospital room?: A Little Help needed climbing 3-5 steps with a railing? : A Little 6 Click Score: 22    End of Session Equipment Utilized During Treatment: Gait belt Activity Tolerance: Patient tolerated treatment well Patient left: in chair;with chair alarm set;with call bell/phone within reach;with family/visitor present Nurse Communication: Mobility status PT Visit Diagnosis: Other abnormalities of gait and mobility (R26.89);Difficulty in walking, not elsewhere classified (R26.2);Muscle weakness (generalized) (M62.81)    Time: 2229-7989 PT Time Calculation (min) (ACUTE ONLY): 21 min   Charges:   PT Evaluation $PT Eval Low Complexity: 1 Low PT Treatments $Therapeutic Activity: 8-22 mins        Lieutenant Diego PT, DPT 10:08 AM,06/06/22

## 2022-06-07 DIAGNOSIS — N186 End stage renal disease: Secondary | ICD-10-CM | POA: Diagnosis not present

## 2022-06-07 DIAGNOSIS — I10 Essential (primary) hypertension: Secondary | ICD-10-CM | POA: Diagnosis not present

## 2022-06-07 DIAGNOSIS — C61 Malignant neoplasm of prostate: Secondary | ICD-10-CM

## 2022-06-07 DIAGNOSIS — N401 Enlarged prostate with lower urinary tract symptoms: Secondary | ICD-10-CM | POA: Diagnosis not present

## 2022-06-07 DIAGNOSIS — Z992 Dependence on renal dialysis: Secondary | ICD-10-CM | POA: Diagnosis not present

## 2022-06-07 DIAGNOSIS — N1832 Chronic kidney disease, stage 3b: Secondary | ICD-10-CM | POA: Diagnosis not present

## 2022-06-07 DIAGNOSIS — N179 Acute kidney failure, unspecified: Secondary | ICD-10-CM | POA: Diagnosis not present

## 2022-06-07 LAB — CBC
HCT: 24.1 % — ABNORMAL LOW (ref 39.0–52.0)
Hemoglobin: 8.5 g/dL — ABNORMAL LOW (ref 13.0–17.0)
MCH: 32.3 pg (ref 26.0–34.0)
MCHC: 35.3 g/dL (ref 30.0–36.0)
MCV: 91.6 fL (ref 80.0–100.0)
Platelets: 203 10*3/uL (ref 150–400)
RBC: 2.63 MIL/uL — ABNORMAL LOW (ref 4.22–5.81)
RDW: 16.3 % — ABNORMAL HIGH (ref 11.5–15.5)
WBC: 6.4 10*3/uL (ref 4.0–10.5)
nRBC: 0 % (ref 0.0–0.2)

## 2022-06-07 LAB — BASIC METABOLIC PANEL
Anion gap: 13 (ref 5–15)
BUN: 91 mg/dL — ABNORMAL HIGH (ref 8–23)
CO2: 22 mmol/L (ref 22–32)
Calcium: 8.7 mg/dL — ABNORMAL LOW (ref 8.9–10.3)
Chloride: 89 mmol/L — ABNORMAL LOW (ref 98–111)
Creatinine, Ser: 5.18 mg/dL — ABNORMAL HIGH (ref 0.61–1.24)
GFR, Estimated: 10 mL/min — ABNORMAL LOW (ref >60)
Glucose, Bld: 98 mg/dL (ref 70–99)
Potassium: 4.1 mmol/L (ref 3.5–5.1)
Sodium: 124 mmol/L — ABNORMAL LOW (ref 135–145)

## 2022-06-07 MED ORDER — OXYCODONE HCL 5 MG PO TABS
5.0000 mg | ORAL_TABLET | Freq: Four times a day (QID) | ORAL | Status: DC | PRN
  Administered 2022-06-07 – 2022-06-10 (×4): 5 mg via ORAL
  Filled 2022-06-07 (×4): qty 1

## 2022-06-07 NOTE — Care Management Important Message (Signed)
Important Message  Patient Details  Name: Aaron Grimes MRN: [id number] Date of Birth: 12/18/1929   Medicare Important Message Given:  Yes     Royale Lennartz, Leroy Sea 06/07/2022, 1:32 PM

## 2022-06-07 NOTE — Progress Notes (Signed)
Physical Therapy Treatment Patient Details Name: Aaron Grimes MRN: [id number] DOB: 24-Jan-1930 Today's Date: 06/07/2022   History of Present Illness Patient is a 88 year old male with history of neuropathy, hypertension, hyperlipidemia, BPH, diastolic CHF,  prostate cancer who presented after his PCP referred him for the evaluation of abnormal labs.    PT Comments    Patient alert, very motivated for PT. Reported some R shoulder pain/stiffness today, RN notified. He was able to perform sit <> stand with supervision, RW. He ambulated ~351f with RW and CGA-supervision, did take an extended standing rest break between loops around the RN station. The patient would benefit from further skilled PT intervention to continue to progress towards goals. Recommendation remains appropriate.     Recommendations for follow up therapy are one component of a multi-disciplinary discharge planning process, led by the attending physician.  Recommendations may be updated based on patient status, additional functional criteria and insurance authorization.  Follow Up Recommendations  Home health PT     Assistance Recommended at Discharge Intermittent Supervision/Assistance  Patient can return home with the following Assist for transportation;Help with stairs or ramp for entrance;Assistance with cooking/housework   Equipment Recommendations  Rolling walker (2 wheels)    Recommendations for Other Services       Precautions / Restrictions Precautions Precautions: Fall Restrictions Weight Bearing Restrictions: No     Mobility  Bed Mobility               General bed mobility comments: Pt in recliner upon arrival.    Transfers Overall transfer level: Needs assistance Equipment used: Rolling walker (2 wheels) Transfers: Sit to/from Stand Sit to Stand: Supervision                Ambulation/Gait Ambulation/Gait assistance: Supervision Gait Distance (Feet): 300 Feet Assistive  device: Rolling walker (2 wheels)         General Gait Details: no LOB. able to walk two laps after a seated rest break   Stairs             Wheelchair Mobility    Modified Rankin (Stroke Patients Only)       Balance Overall balance assessment: Needs assistance Sitting-balance support: Feet supported Sitting balance-Leahy Scale: Good       Standing balance-Leahy Scale: Fair                 High Level Balance Comments: improved balance with BUE support            Cognition Arousal/Alertness: Awake/alert Behavior During Therapy: WFL for tasks assessed/performed Overall Cognitive Status: Within Functional Limits for tasks assessed                                          Exercises      General Comments        Pertinent Vitals/Pain Pain Assessment Pain Assessment: 0-10 Pain Score: 5  Pain Location: Shoulder Pain Descriptors / Indicators: Aching, Sore Pain Intervention(s): Limited activity within patient's tolerance, Monitored during session, Repositioned    Home Living                          Prior Function            PT Goals (current goals can now be found in the care plan section) Progress towards PT goals: Progressing toward goals  Frequency    Min 2X/week      PT Plan Current plan remains appropriate    Co-evaluation              AM-PAC PT "6 Clicks" Mobility   Outcome Measure  Help needed turning from your back to your side while in a flat bed without using bedrails?: None Help needed moving from lying on your back to sitting on the side of a flat bed without using bedrails?: None Help needed moving to and from a bed to a chair (including a wheelchair)?: None Help needed standing up from a chair using your arms (e.g., wheelchair or bedside chair)?: None Help needed to walk in hospital room?: A Little Help needed climbing 3-5 steps with a railing? : A Little 6 Click Score: 22     End of Session Equipment Utilized During Treatment: Gait belt Activity Tolerance: Patient tolerated treatment well Patient left: in chair;with chair alarm set;with call bell/phone within reach;with family/visitor present Nurse Communication: Mobility status PT Visit Diagnosis: Other abnormalities of gait and mobility (R26.89);Difficulty in walking, not elsewhere classified (R26.2);Muscle weakness (generalized) (M62.81)     Time: 4081-4481 PT Time Calculation (min) (ACUTE ONLY): 20 min  Charges:  $Therapeutic Activity: 8-22 mins                     Lieutenant Diego PT, DPT 12:51 PM,06/07/22

## 2022-06-07 NOTE — Plan of Care (Signed)

## 2022-06-07 NOTE — Progress Notes (Signed)
PROGRESS NOTE  Aaron Grimes  QVZ:[id number] DOB: 1930-07-08 DOA: 05/29/2022 PCP: Lenard Simmer, MD   Brief Narrative:  Patient is a 88 year old male with history of neuropathy, hypertension, hyperlipidemia, BPH, prostate cancer who presented after his PCP referred him for the evaluation of abnormal labs.  On presentation ,he was hemodynamically stable.  Lab work showed sodium of 126, potassium of 3, creatinine of 4.65.  BNP was elevated.  Patient reported worsening shortness of breath, lower extremity swelling, decreased urine output for the last 2 weeks.  CT renal study showed bilateral perinephric stranding without hydronephrosis .urine retention was suspected and Foley was placed.  Nephrology consulted for severe worsening AKI.  Currently being monitored without dialysis  Assessment & Plan:  Principal Problem:   AKI (acute kidney injury) ([place]) Active Problems:   BPH with obstruction/lower urinary tract symptoms   Hypokalemia   Essential hypertension   Heart failure with preserved ejection fraction (HCC)   Iron deficiency anemia   Obstructive uropathy   Anemia due to stage 3b chronic kidney disease ([place])   AKI: Baseline creatinine normal.  Creatinine was 3.1 on 8/23 which was done as an outpatient.  Patient referred here by his PCP. History of BPH/prostate cancer.  Also taking torsemide at home and not having good urine  output. CT renal study showed bilateral perinephric stranding without hydronephrosis .urine retention was suspected and Foley was placed.  Nephrology consulted and following. Patient looked volume overloaded on presentation, with elevated BNP.  Bilateral lower extremity edema has improved.  Lungs are clear on auscultation.  Continue monitoring without dialysis.  Hypertension: Home lisinopril on hold due to AKI.  , continue prn Medications for severe hypertension.  Currently blood pressure soft  History of hyperlipidemia: Takes rosuvastatin  History of  carotid artery disease: On aspirin at home, now on hold  Normocytic anemia: No evidence of acute blood loss.  Hemoglobin currently in the range of 7-8.  Hemoglobin was 10.4 on 10/23/2021.  Patient taking iron pill.  He reports black stools but attributes that to iron pills.  Iron studies showed low iron, given a dose of IV iron.  He denies any frank hematochezia or melena.  Negative FOBT.  Severe bilateral lower extremity more on the right: Likely from volume overload.  Venous Doppler did not show any DVT.  Edema improved , continue compression stockings  Hyponatremia: This is most likely from volume overload.  Continue to monitor.  History of diastolic congestive heart failure: Follows with cardiology.  Echo done on 03/19/2022 showed EF of 60 to 32%, grade 2 diastolic dysfunction.  Currently appears well overloaded, on torsemide  at home.elevated BNP.  Cardio  following here.  New echo showed EF of 50 to 55%, indeterminate left ventricular diastolic parameters  Elevated troponin: This is likely from demand ischemia.  Troponin significantly high with flat trend.  Denies any chest pain.  Cardiology following  History BPH/prostate cancer: Continue finasteride.  He follows with urology.  Bone scan on 8/23 did not show any skeletal metastatic disease.  CT abdomen/pelvis done on 04/23/2022 showed small to borderline enlarged abdominal/pelvic retroperitoneal lymph nodes indicative of metastatic disease.  Urology planning to start on ADT but he wants to wait.  He does not want referral to the cancer center.  Patient requested for urology follow-up here, message sent to urology  Debility/deconditioning: Patient looks very weak, deconditioned.  PT/OT recommendation is home health      DVT prophylaxis:heparin injection 5,000 Units Start: 06/14/2022 2200 Place TED  hose Start: 06/06/2022 1216     Code Status: DNR  Family Communication: Daughter at bedside on 9/15  Patient status:Inpatient  Patient is from  :Home  Anticipated discharge ZO:XWRU  Estimated DC date:not sure at the moment, awaiting kidney function improvement   Consultants: Nephrology  Procedures:None  Antimicrobials:  Anti-infectives (From admission, onward)    None       Subjective:  Seen and examined at the bedside today.  Sitting in the chair.  Eating his breakfast.  Overall looks comfortable, hemodynamically stable but complains of loss of appetite and weakness today   objective: Vitals:   06/07/22 0035 06/07/22 0350 06/07/22 0900 06/07/22 1214  BP: (!) 121/54 117/65 (!) 123/105 (!) 102/32  Pulse: 70 (!) 41 81 (!) 40  Resp: '20 19 19 18  '$ Temp: 97.8 F (36.6 C) (!) 97.4 F (36.3 C) (!) 97.5 F (36.4 C) (!) 97.4 F (36.3 C)  TempSrc: Oral Oral    SpO2: 97% 96% 98% 95%  Weight:      Height:        Intake/Output Summary (Last 24 hours) at 06/07/2022 1225 Last data filed at 06/07/2022 1003 Gross per 24 hour  Intake 1457 ml  Output 1552 ml  Net -95 ml   Filed Weights   06/08/2022 0928 06/14/2022 1400  Weight: 66.7 kg 66.1 kg    Examination:  General exam: Overall comfortable, not in distress, pleasant elderly male HEENT: PERRL Respiratory system:  no wheezes or crackles  Cardiovascular system: S1 & S2 heard, RRR.  Gastrointestinal system: Abdomen is nondistended, soft and nontender. Central nervous system: Alert and oriented Extremities: Trace bilateral lower extremity edema, no clubbing ,no cyanosis Skin: No rashes, no ulcers,no icterus   GU: Foley   Data Reviewed: I have personally reviewed following labs and imaging studies  CBC: Recent Labs  Lab 05/25/2022 0945 06/05/22 0625 06/06/22 0536 06/07/22 0505  WBC 7.6 5.5 7.0 6.4  NEUTROABS 5.9  --   --   --   HGB 8.6* 7.8* 8.3* 8.5*  HCT 25.1* 22.1* 23.8* 24.1*  MCV 92.3 90.6 90.2 91.6  PLT 185 161 176 045   Basic Metabolic Panel: Recent Labs  Lab 06/12/2022 0945 06/05/22 0625 06/06/22 0536 06/07/22 0505  NA 126* 126* 123* 124*  K  3.0* 4.1 4.1 4.1  CL 88* 92* 89* 89*  CO2 23 21* 22 22  GLUCOSE 124* 119* 118* 98  BUN 79* 81* 87* 91*  CREATININE 4.65* 4.81* 5.01* 5.18*  CALCIUM 8.9 8.6* 8.6* 8.7*  MG 2.5*  --   --   --      Recent Results (from the past 240 hour(s))  Urine Culture     Status: None   Collection Time: 06/09/2022 12:14 PM   Specimen: Urine, Clean Catch  Result Value Ref Range Status   Specimen Description   Final    URINE, CLEAN CATCH Performed at [facility], [address] [place], [place] [address]    Special Requests   Final    NONE Performed at [facility], [address], [place], [place] [address]    Culture   Final    NO GROWTH Performed at [facility], [place] [address] [place], [place] [address]    Report Status 06/05/2022 FINAL  Final     Radiology Studies: ECHOCARDIOGRAM COMPLETE  Result Date: 06/06/2022    ECHOCARDIOGRAM REPORT   Patient Name:   Aaron Grimes Date of Exam: 06/06/2022 Medical Rec #:  [id number]             Height:       68.0 in Accession #:    [id number]            Weight:       145.8 lb Date of Birth:  Aug 03, 1930            BSA:          1.787 m Patient Age:    44 years              BP:           111/48 mmHg Patient Gender: M                     HR:           40 bpm. Exam Location:  [facility] Procedure: 2D Echo, Cardiac Doppler and Color Doppler Indications:     CHF-acute diastolic E31.54  History:         Patient has no prior history of Echocardiogram examinations.                  Risk Factors:Dyslipidemia and Hypertension.  Sonographer:     Sherrie Sport Referring Phys:  Minna Merritts Diagnosing Phys: Ida Rogue MD  Sonographer Comments: Suboptimal apical window. IMPRESSIONS  1. Left ventricular ejection fraction, by estimation, is 50 to 55%. The left ventricle has low normal function. The left ventricle has no regional wall motion abnormalities. There is moderate left ventricular hypertrophy. Left ventricular diastolic parameters  are indeterminate.  2. Right ventricular systolic function is mildly reduced. The right ventricular size is moderately enlarged. There is normal pulmonary artery systolic pressure. The estimated right ventricular systolic pressure is 00.8 mmHg.  3. Left atrial size was mildly dilated.  4. Moderate pleural effusion in the left lateral region.  5. The mitral valve is normal in structure. Moderate mitral valve regurgitation. No evidence of mitral stenosis.  6. Tricuspid valve regurgitation is moderate.  7. The aortic valve is tricuspid. Aortic valve regurgitation is not visualized. Aortic valve sclerosis/calcification is present, without any evidence of aortic stenosis. Aortic valve mean gradient measures 3.0 mmHg.  8. The inferior vena cava is normal in size with greater than 50% respiratory variability, suggesting right atrial pressure of 3 mmHg.  9. Bradycardia, rate 44 bpm 10. Right atrial size was mildly dilated. FINDINGS  Left Ventricle: Left ventricular ejection fraction, by estimation, is 50 to 55%. The left ventricle has low normal function. The left ventricle has no regional wall motion abnormalities. The left ventricular internal cavity size was normal in size. There is moderate left ventricular hypertrophy. Left ventricular diastolic parameters are indeterminate. Right Ventricle: The right ventricular size is moderately enlarged. No increase in right ventricular wall thickness. Right ventricular systolic function is mildly reduced. There is normal pulmonary artery systolic pressure. The tricuspid regurgitant velocity is 2.23 m/s, and with an assumed right atrial pressure of 5 mmHg, the estimated right ventricular systolic pressure is 67.6 mmHg. Left Atrium: Left atrial size was mildly dilated. Right Atrium: Right atrial size was mildly dilated. Pericardium: There is no evidence of pericardial effusion. Mitral Valve: The mitral valve is normal in structure. Moderate mitral valve regurgitation. No evidence of  mitral valve stenosis. Tricuspid Valve: The tricuspid valve is normal in structure. Tricuspid valve regurgitation is moderate . No evidence of tricuspid stenosis. Aortic Valve: The aortic valve is tricuspid. Aortic valve regurgitation is not visualized. Aortic valve  sclerosis/calcification is present, without any evidence of aortic stenosis. Aortic valve mean gradient measures 3.0 mmHg. Aortic valve peak gradient measures 5.5 mmHg. Aortic valve area, by VTI measures 2.43 cm. Pulmonic Valve: The pulmonic valve was normal in structure. Pulmonic valve regurgitation is mild. No evidence of pulmonic stenosis. Aorta: The aortic root is normal in size and structure. Venous: The inferior vena cava is normal in size with greater than 50% respiratory variability, suggesting right atrial pressure of 3 mmHg. IAS/Shunts: No atrial level shunt detected by color flow Doppler. Additional Comments: There is a moderate pleural effusion in the left lateral region.  LEFT VENTRICLE PLAX 2D LVIDd:         3.10 cm   Diastology LV PW:         1.00 cm   LV e' medial: 8.38 cm/s LV IVS:        1.40 cm LVOT diam:     1.90 cm LV SV:         54 LV SV Index:   30 LVOT Area:     2.84 cm  LEFT ATRIUM           Index        RIGHT ATRIUM           Index LA diam:      4.40 cm 2.46 cm/m   RA Area:     20.00 cm LA Vol (A2C): 37.0 ml 20.71 ml/m  RA Volume:   63.40 ml  35.48 ml/m LA Vol (A4C): 50.4 ml 28.21 ml/m  AORTIC VALVE AV Area (Vmax):    1.85 cm AV Area (Vmean):   1.64 cm AV Area (VTI):     2.43 cm AV Vmax:           117.00 cm/s AV Vmean:          83.400 cm/s AV VTI:            0.224 m AV Peak Grad:      5.5 mmHg AV Mean Grad:      3.0 mmHg LVOT Vmax:         76.50 cm/s LVOT Vmean:        48.200 cm/s LVOT VTI:          0.192 m LVOT/AV VTI ratio: 0.86  AORTA Ao Root diam: 3.20 cm MITRAL VALVE             TRICUSPID VALVE MV Area (PHT): 3.46 cm  TR Peak grad:   19.9 mmHg MV Decel Time: 219 msec  TR Vmax:        223.00 cm/s                            SHUNTS                          Systemic VTI:  0.19 m                          Systemic Diam: 1.90 cm Ida Rogue MD Electronically signed by Ida Rogue MD Signature Date/Time: 06/06/2022/2:07:20 PM    Final     Scheduled Meds:  Chlorhexidine Gluconate Cloth  6 each Topical Daily   finasteride  5 mg Oral QPM   heparin  5,000 Units Subcutaneous Q8H   polyethylene glycol  17 g Oral Daily   rosuvastatin  20 mg Oral QHS  cyanocobalamin  100 mcg Oral Daily   Continuous Infusions:   LOS: 3 days   Shelly Coss, MD [facility] P9/15/2023, 12:25 PM

## 2022-06-07 NOTE — Consult Note (Cosign Needed Addendum)
Urology Consult  I have been asked to see the patient by Dr. Tawanna Solo, for evaluation and management of AKI.  Chief Complaint: Abnormal labs  History of Present Illness: Aaron Grimes is a 88 y.o. year old male with metastatic prostate cancer who received Firmagon loading dose on 05/15/2022 and BPH with LUTS on finasteride admitted on 06/20/2022 after being found by his PCP to have acute renal failure.  Admission labs notable for creatinine 4.65, benign UA, no leukocytosis, and electrolyte derangements including hyponatremia and hypokalemia.  His urine culture has since finalized with no growth.  He underwent CT stone study on admission which revealed bilateral perinephric stranding without hydronephrosis, small bilateral pleural effusions with bibasilar atelectasis, and right posterior eighth rib findings and retroperitoneal lymphadenopathy consistent with known metastatic prostate cancer.  On presentation, his bladder scan was found to be elevated at >462m.  Foley catheter was placed in the emergency department.  Unfortunately, his creatinine has been uptrending despite urinary decompression, 5.18 today.  Nephrology has been consulted.  He previously reached out to our clinic to report wanting to discontinue ADT in the setting of leg edema, fatigue, shortness of breath, and cold/clammy skin.  He was scheduled to follow-up with SZara Councilto discuss this further 2 days ago, but the appointment was canceled due to his current hospitalization.  Foley catheter in place draining clear, yellow urine.  Past Medical History:  Diagnosis Date   Arthritis    BPH (benign prostatic hypertrophy)    Elevated PSA    HLD (hyperlipidemia)    HTN (hypertension)     Past Surgical History:  Procedure Laterality Date   HAND SURGERY Right    nodule removed    Home Medications:  Current Meds  Medication Sig   aspirin EC 81 MG tablet Take 81 mg by mouth daily. Swallow whole.    cyanocobalamin 100 MCG tablet Take 100 mcg by mouth daily.   finasteride (PROSCAR) 5 MG tablet Take 1 tablet (5 mg total) by mouth daily.   lisinopril (PRINIVIL,ZESTRIL) 10 MG tablet    rosuvastatin (CRESTOR) 20 MG tablet Take 20 mg by mouth daily.   torsemide (DEMADEX) 20 MG tablet Take 40 mg by mouth 2 (two) times daily.    Allergies: No Known Allergies  Family History  Problem Relation Age of Onset   Prostate cancer Father        (not sure)   Bone cancer Father    Kidney disease Neg Hx    Kidney cancer Neg Hx    Bladder Cancer Neg Hx     Social History:  reports that he has never smoked. He has never used smokeless tobacco. He reports that he does not drink alcohol and does not use drugs.  ROS: A complete review of systems was performed.  All systems are negative except for pertinent findings as noted.  Physical Exam:  Vital signs in last 24 hours: Temp:  [95.5 F (35.3 C)-98.1 F (36.7 C)] 98 F (36.7 C) (09/15 1600) Pulse Rate:  [40-81] 40 (09/15 1543) Resp:  [18-20] 19 (09/15 1543) BP: (102-123)/(32-105) 110/53 (09/15 1543) SpO2:  [94 %-99 %] 99 % (09/15 1543) Constitutional:  Alert and oriented, no acute distress HEENT: [place] AT, moist mucus membranes Cardiovascular: No clubbing, cyanosis, or edema Respiratory: Normal respiratory effort Skin: No rashes, bruises or suspicious lesions Neurologic: Grossly intact, no focal deficits, moving all 4 extremities Psychiatric: Normal mood and affect  Laboratory Data:  Recent Labs    06/05/22 [id number]  06/06/22 0536 06/07/22 0505  WBC 5.5 7.0 6.4  HGB 7.8* 8.3* 8.5*  HCT 22.1* 23.8* 24.1*   Recent Labs    06/05/22 0625 06/06/22 0536 06/07/22 0505  NA 126* 123* 124*  K 4.1 4.1 4.1  CL 92* 89* 89*  CO2 21* 22 22  GLUCOSE 119* 118* 98  BUN 81* 87* 91*  CREATININE 4.81* 5.01* 5.18*  CALCIUM 8.6* 8.6* 8.7*   Urinalysis    Component Value Date/Time   COLORURINE YELLOW (A) 06/06/2022 1214   APPEARANCEUR HAZY (A)  06/01/2022 1214   LABSPEC 1.006 05/27/2022 1214   PHURINE 5.0 06/09/2022 1214   GLUCOSEU NEGATIVE 06/19/2022 1214   HGBUR MODERATE (A) 06/09/2022 1214   BILIRUBINUR NEGATIVE 06/09/2022 [place] 06/03/2022 1214   PROTEINUR 30 (A) 05/29/2022 1214   NITRITE NEGATIVE 06/08/2022 1214   LEUKOCYTESUR NEGATIVE 06/08/2022 1214   Results for orders placed or performed during the hospital encounter of 06/09/2022  Urine Culture     Status: None   Collection Time: 06/18/2022 12:14 PM   Specimen: Urine, Clean Catch  Result Value Ref Range Status   Specimen Description   Final    URINE, CLEAN CATCH Performed at [facility], [address] [place], [place] [address]    Special Requests   Final    NONE Performed at [facility], [address] [place], [place] [address]    Culture   Final    NO GROWTH Performed at [facility], [place] [address] [place], [place] [address]    Report Status 06/05/2022 FINAL  Final   Assessment & Plan:  88 year old male with metastatic prostate cancer admitted with acute renal failure, volume overload, and hypokalemia with uptrending creatinine despite Foley catheter placement for urinary retention.  Unfortunately, with no improvement in renal function despite urinary decompression, there is little we can offer from the urologic perspective at this time.  Agree with nephrology involvement.  I will reschedule him to see Zara Council in clinic to discuss discontinuation of ADT for outpatient voiding trial.  Unfortunately, as previously shared with the patient there is no antidote to the [place] he has already received, and I do not think that it is responsible for his rising creatinine.  He expressed understanding.  Thank you for involving me in this patient's care, please page with any further questions or concerns.  Debroah Loop, PA-C 06/07/2022 5:05 PM

## 2022-06-07 NOTE — Progress Notes (Signed)
[place], [place] 06/07/22  Subjective:   Hospital day # 3  Patient clinically feels slightly better today.  Feels like his lower extremity edema is not as much.  He has a Foley catheter in place.  Urine output also appears to be improving.  Unfortunately creatinine is higher again today.  States that he wants to walk.  Nursing team reports that he was quite weak when he walked in the hallways yesterday. Renal: 09/14 0701 - 09/15 0700 In: 1017 [P.O.:1220; IV Piggyback:117] Out: 5102 [Urine:1800; Stool:2] Lab Results  Component Value Date   CREATININE 5.18 (H) 06/07/2022   CREATININE 5.01 (H) 06/06/2022   CREATININE 4.81 (H) 06/05/2022     Objective:  Vital signs in last 24 hours:  Temp:  [97.4 F (36.3 C)-98.1 F (36.7 C)] 97.4 F (36.3 C) (09/15 1214) Pulse Rate:  [40-81] 40 (09/15 1214) Resp:  [18-20] 18 (09/15 1214) BP: (102-123)/(32-105) 102/32 (09/15 1214) SpO2:  [94 %-98 %] 95 % (09/15 1214)  Weight change:  Filed Weights   06/14/2022 0928 06/20/2022 1400  Weight: 66.7 kg 66.1 kg    Intake/Output:    Intake/Output Summary (Last 24 hours) at 06/07/2022 1307 Last data filed at 06/07/2022 1003 Gross per 24 hour  Intake 1457 ml  Output 1552 ml  Net -95 ml      Physical Exam: General: Sitting up in the recliner chair  HEENT Anicteric, moist oral mucous membranes  Pulm/lungs Normal breathing effort, clear to auscultation  CVS/Heart Regular, no rub  Abdomen:  Soft, nontender, nondistended  Extremities: Right greater than left 2-3+ pitting edema  Neurologic: Alert, oriented  Skin: No acute rashes   Foley in place       Basic Metabolic Panel:  Recent Labs  Lab 06/22/2022 0945 06/05/22 0625 06/06/22 0536 06/07/22 0505  NA 126* 126* 123* 124*  K 3.0* 4.1 4.1 4.1  CL 88* 92* 89* 89*  CO2 23 21* 22 22  GLUCOSE 124* 119* 118* 98  BUN 79* 81* 87* 91*  CREATININE 4.65* 4.81* 5.01* 5.18*  CALCIUM 8.9 8.6* 8.6* 8.7*  MG 2.5*   --   --   --       CBC: Recent Labs  Lab 06/14/2022 0945 06/05/22 0625 06/06/22 0536 06/07/22 0505  WBC 7.6 5.5 7.0 6.4  NEUTROABS 5.9  --   --   --   HGB 8.6* 7.8* 8.3* 8.5*  HCT 25.1* 22.1* 23.8* 24.1*  MCV 92.3 90.6 90.2 91.6  PLT 185 161 176 203      No results found for: "HEPBSAG", "HEPBSAB", "HEPBIGM"    Microbiology:  Recent Results (from the past 240 hour(s))  Urine Culture     Status: None   Collection Time: 06/01/2022 12:14 PM   Specimen: Urine, Clean Catch  Result Value Ref Range Status   Specimen Description   Final    URINE, CLEAN CATCH Performed at [facility], [address], [place], [place] [address]    Special Requests   Final    NONE Performed at [facility], [address], [place], [place] [address]    Culture   Final    NO GROWTH Performed at [facility], [place] [address], [place], [place] [address]    Report Status 06/05/2022 FINAL  Final    Coagulation Studies: No results for input(s): "LABPROT", "INR" in the last 72 hours.  Urinalysis: No results for input(s): "COLORURINE", "LABSPEC", "PHURINE", "GLUCOSEU", "HGBUR", "BILIRUBINUR", "KETONESUR", "PROTEINUR", "UROBILINOGEN", "  NITRITE", "LEUKOCYTESUR" in the last 72 hours.  Invalid input(s): "APPERANCEUR"     Imaging: ECHOCARDIOGRAM COMPLETE  Result Date: 06/06/2022    ECHOCARDIOGRAM REPORT   Patient Name:   Aaron Grimes Date of Exam: 06/06/2022 Medical Rec #:  [id number]             Height:       68.0 in Accession #:    [id number]            Weight:       145.8 lb Date of Birth:  01-19-1930            BSA:          1.787 m Patient Age:    21 years              BP:           111/48 mmHg Patient Gender: M                     HR:           40 bpm. Exam Location:  [facility] Procedure: 2D Echo, Cardiac Doppler and Color Doppler Indications:     CHF-acute diastolic D14.97  History:         Patient has no prior history of Echocardiogram examinations.                   Risk Factors:Dyslipidemia and Hypertension.  Sonographer:     Sherrie Sport Referring Phys:  Minna Merritts Diagnosing Phys: Ida Rogue MD  Sonographer Comments: Suboptimal apical window. IMPRESSIONS  1. Left ventricular ejection fraction, by estimation, is 50 to 55%. The left ventricle has low normal function. The left ventricle has no regional wall motion abnormalities. There is moderate left ventricular hypertrophy. Left ventricular diastolic parameters are indeterminate.  2. Right ventricular systolic function is mildly reduced. The right ventricular size is moderately enlarged. There is normal pulmonary artery systolic pressure. The estimated right ventricular systolic pressure is 02.6 mmHg.  3. Left atrial size was mildly dilated.  4. Moderate pleural effusion in the left lateral region.  5. The mitral valve is normal in structure. Moderate mitral valve regurgitation. No evidence of mitral stenosis.  6. Tricuspid valve regurgitation is moderate.  7. The aortic valve is tricuspid. Aortic valve regurgitation is not visualized. Aortic valve sclerosis/calcification is present, without any evidence of aortic stenosis. Aortic valve mean gradient measures 3.0 mmHg.  8. The inferior vena cava is normal in size with greater than 50% respiratory variability, suggesting right atrial pressure of 3 mmHg.  9. Bradycardia, rate 44 bpm 10. Right atrial size was mildly dilated. FINDINGS  Left Ventricle: Left ventricular ejection fraction, by estimation, is 50 to 55%. The left ventricle has low normal function. The left ventricle has no regional wall motion abnormalities. The left ventricular internal cavity size was normal in size. There is moderate left ventricular hypertrophy. Left ventricular diastolic parameters are indeterminate. Right Ventricle: The right ventricular size is moderately enlarged. No increase in right ventricular wall thickness. Right ventricular systolic function is mildly reduced. There is normal  pulmonary artery systolic pressure. The tricuspid regurgitant velocity is 2.23 m/s, and with an assumed right atrial pressure of 5 mmHg, the estimated right ventricular systolic pressure is 37.8 mmHg. Left Atrium: Left atrial size was mildly dilated. Right Atrium: Right atrial size was mildly dilated. Pericardium: There is no evidence of pericardial effusion. Mitral Valve: The mitral valve is normal in structure. Moderate  mitral valve regurgitation. No evidence of mitral valve stenosis. Tricuspid Valve: The tricuspid valve is normal in structure. Tricuspid valve regurgitation is moderate . No evidence of tricuspid stenosis. Aortic Valve: The aortic valve is tricuspid. Aortic valve regurgitation is not visualized. Aortic valve sclerosis/calcification is present, without any evidence of aortic stenosis. Aortic valve mean gradient measures 3.0 mmHg. Aortic valve peak gradient measures 5.5 mmHg. Aortic valve area, by VTI measures 2.43 cm. Pulmonic Valve: The pulmonic valve was normal in structure. Pulmonic valve regurgitation is mild. No evidence of pulmonic stenosis. Aorta: The aortic root is normal in size and structure. Venous: The inferior vena cava is normal in size with greater than 50% respiratory variability, suggesting right atrial pressure of 3 mmHg. IAS/Shunts: No atrial level shunt detected by color flow Doppler. Additional Comments: There is a moderate pleural effusion in the left lateral region.  LEFT VENTRICLE PLAX 2D LVIDd:         3.10 cm   Diastology LV PW:         1.00 cm   LV e' medial: 8.38 cm/s LV IVS:        1.40 cm LVOT diam:     1.90 cm LV SV:         54 LV SV Index:   30 LVOT Area:     2.84 cm  LEFT ATRIUM           Index        RIGHT ATRIUM           Index LA diam:      4.40 cm 2.46 cm/m   RA Area:     20.00 cm LA Vol (A2C): 37.0 ml 20.71 ml/m  RA Volume:   63.40 ml  35.48 ml/m LA Vol (A4C): 50.4 ml 28.21 ml/m  AORTIC VALVE AV Area (Vmax):    1.85 cm AV Area (Vmean):   1.64 cm AV Area  (VTI):     2.43 cm AV Vmax:           117.00 cm/s AV Vmean:          83.400 cm/s AV VTI:            0.224 m AV Peak Grad:      5.5 mmHg AV Mean Grad:      3.0 mmHg LVOT Vmax:         76.50 cm/s LVOT Vmean:        48.200 cm/s LVOT VTI:          0.192 m LVOT/AV VTI ratio: 0.86  AORTA Ao Root diam: 3.20 cm MITRAL VALVE             TRICUSPID VALVE MV Area (PHT): 3.46 cm  TR Peak grad:   19.9 mmHg MV Decel Time: 219 msec  TR Vmax:        223.00 cm/s                           SHUNTS                          Systemic VTI:  0.19 m                          Systemic Diam: 1.90 cm Ida Rogue MD Electronically signed by Ida Rogue MD Signature Date/Time: 06/06/2022/2:07:20 PM    Final  Medications:     Chlorhexidine Gluconate Cloth  6 each Topical Daily   finasteride  5 mg Oral QPM   heparin  5,000 Units Subcutaneous Q8H   polyethylene glycol  17 g Oral Daily   rosuvastatin  20 mg Oral QHS   cyanocobalamin  100 mcg Oral Daily   acetaminophen **OR** acetaminophen, hydrALAZINE, ondansetron **OR** ondansetron (ZOFRAN) IV, mouth rinse, senna-docusate  Assessment/ Plan:  88 y.o. male with hypertension, BPH, hyperlipidemia, suspected prostate cancer   admitted on 05/24/2022 for AKI (acute kidney injury) ([place]) [N17.9]  #Acute kidney injury Unclear cause but likely secondary to aggressive diuresis.  Patient also had reported short-term nonsteroidal use.  Baseline creatinine of 1.5 from April 23, 2022.  No IV contrast exposure. Currently Foley catheter is in place and urine output appears to be improving.  1800 from last 24 hours. -Continue to monitor closely.  No uremic symptoms or acute indication for dialysis at present.  #Hyponatremia Secondary to volume overload.  Expected to improve as volume status corrects.  #Chronic diastolic CHF, Lower extremity edema As per cardiology note, patient has grade 3 diastolic dysfunction. Diuretic on hold at present Edema appears to be improving some.  We  will continue to monitor closely.   LOS: 3 Aaron Grimes 9/15/20231:07 PM  [place], [place]  Note: This note was prepared with Dragon dictation. Any transcription errors are unintentional

## 2022-06-07 NOTE — Progress Notes (Signed)
Mobility Specialist - Progress Note   06/07/22 1350  Mobility  Activity Ambulated with assistance in hallway  Level of Assistance Standby assist, set-up cues, supervision of patient - no hands on  Assistive Device Front wheel walker  Distance Ambulated (ft) 320 ft  Activity Response Tolerated well  $Mobility charge 1 Mobility   Pt sitting in recliner on RA upon arrival. Pt STS indep with extra time and VC on hand placement. Pt ambulates 2 laps around NS SBA. PT takes 1 standing rest break after first lap. Pt returns to recliner with needs in reach and chair alarm on.   Gretchen Short  Mobility Specialist  06/07/22 1:52 PM

## 2022-06-07 NOTE — Progress Notes (Signed)
Rounding Note    Patient Name: Aaron Grimes Date of Encounter: 06/07/2022  [place] Cardiologist: new to [facility]  Subjective   Reports not sleeping well Denies significant shortness of breath, no abdominal fullness Creatinine running higher, discussed with him Telemetry reviewed, P waves noted on telemetry (more notable today), rate in the 40s, blood pressure stable  Inpatient Medications    Scheduled Meds:  Chlorhexidine Gluconate Cloth  6 each Topical Daily   finasteride  5 mg Oral QPM   heparin  5,000 Units Subcutaneous Q8H   polyethylene glycol  17 g Oral Daily   rosuvastatin  20 mg Oral QHS   cyanocobalamin  100 mcg Oral Daily   Continuous Infusions:  PRN Meds: acetaminophen **OR** acetaminophen, hydrALAZINE, ondansetron **OR** ondansetron (ZOFRAN) IV, mouth rinse, senna-docusate   Vital Signs    Vitals:   06/06/22 2100 06/07/22 0035 06/07/22 0350 06/07/22 0900  BP: (!) 104/57 (!) 121/54 117/65 (!) 123/105  Pulse: (!) 45 70 (!) 41 81  Resp: '18 20 19 19  '$ Temp: 98.1 F (36.7 C) 97.8 F (36.6 C) (!) 97.4 F (36.3 C) (!) 97.5 F (36.4 C)  TempSrc: Oral Oral Oral   SpO2: 94% 97% 96% 98%  Weight:      Height:        Intake/Output Summary (Last 24 hours) at 06/07/2022 1048 Last data filed at 06/07/2022 1003 Gross per 24 hour  Intake 1457 ml  Output 1552 ml  Net -95 ml      06/14/2022    2:00 PM 06/13/2022    9:28 AM 04/09/2022   11:19 AM  Last 3 Weights  Weight (lbs) 145 lb 12.8 oz 147 lb 0.8 oz 147 lb  Weight (kg) 66.134 kg 66.7 kg 66.679 kg      Telemetry    Sinus bradycardia rate in the 40s- Personally Reviewed  ECG     - Personally Reviewed  Physical Exam   GEN: No acute distress.   Neck: No JVD Cardiac: RRR, no murmurs, rubs, or gallops.  Respiratory: Clear to auscultation bilaterally. GI: Soft, nontender, non-distended  MS: No edema; No deformity. Neuro:  Nonfocal  Psych: Normal affect   Labs    High  Sensitivity Troponin:   Recent Labs  Lab 06/22/2022 0945 06/08/2022 1147  TROPONINIHS 245* 239*     Chemistry Recent Labs  Lab 05/29/2022 0945 06/05/22 0625 06/06/22 0536 06/07/22 0505  NA 126* 126* 123* 124*  K 3.0* 4.1 4.1 4.1  CL 88* 92* 89* 89*  CO2 23 21* 22 22  GLUCOSE 124* 119* 118* 98  BUN 79* 81* 87* 91*  CREATININE 4.65* 4.81* 5.01* 5.18*  CALCIUM 8.9 8.6* 8.6* 8.7*  MG 2.5*  --   --   --   PROT 5.9*  --   --   --   ALBUMIN 3.0*  --   --   --   AST 80*  --   --   --   ALT 43  --   --   --   ALKPHOS 96  --   --   --   BILITOT 0.8  --   --   --   GFRNONAA 11* 11* 10* 10*  ANIONGAP '15 13 12 13    '$ Lipids No results for input(s): "CHOL", "TRIG", "HDL", "LABVLDL", "LDLCALC", "CHOLHDL" in the last 168 hours.  Hematology Recent Labs  Lab 06/05/22 0625 06/06/22 0536 06/07/22 0505  WBC 5.5 7.0 6.4  RBC 2.44* 2.64*  2.63*  HGB 7.8* 8.3* 8.5*  HCT 22.1* 23.8* 24.1*  MCV 90.6 90.2 91.6  MCH 32.0 31.4 32.3  MCHC 35.3 34.9 35.3  RDW 15.9* 16.0* 16.3*  PLT 161 176 203   Thyroid  Recent Labs  Lab 06/15/2022 0945  TSH 2.501    BNP Recent Labs  Lab 06/21/2022 0945  BNP 2,819.7*    DDimer No results for input(s): "DDIMER" in the last 168 hours.   Radiology    ECHOCARDIOGRAM COMPLETE  Result Date: 06/06/2022    ECHOCARDIOGRAM REPORT   Patient Name:   Linell Shawn Date of Exam: 06/06/2022 Medical Rec #:  [id number]             Height:       68.0 in Accession #:    [id number]            Weight:       145.8 lb Date of Birth:  [birth year]            BSA:          1.787 m Patient Age:    88 years              BP:           111/48 mmHg Patient Gender: M                     HR:           40 bpm. Exam Location:  [facility] Procedure: 2D Echo, Cardiac Doppler and Color Doppler Indications:     CHF-acute diastolic Q00.86  History:         Patient has no prior history of Echocardiogram examinations.                  Risk Factors:Dyslipidemia and Hypertension.  Sonographer:      Sherrie Sport Referring Phys:  Minna Merritts Diagnosing Phys: Ida Rogue MD  Sonographer Comments: Suboptimal apical window. IMPRESSIONS  1. Left ventricular ejection fraction, by estimation, is 50 to 55%. The left ventricle has low normal function. The left ventricle has no regional wall motion abnormalities. There is moderate left ventricular hypertrophy. Left ventricular diastolic parameters are indeterminate.  2. Right ventricular systolic function is mildly reduced. The right ventricular size is moderately enlarged. There is normal pulmonary artery systolic pressure. The estimated right ventricular systolic pressure is 76.1 mmHg.  3. Left atrial size was mildly dilated.  4. Moderate pleural effusion in the left lateral region.  5. The mitral valve is normal in structure. Moderate mitral valve regurgitation. No evidence of mitral stenosis.  6. Tricuspid valve regurgitation is moderate.  7. The aortic valve is tricuspid. Aortic valve regurgitation is not visualized. Aortic valve sclerosis/calcification is present, without any evidence of aortic stenosis. Aortic valve mean gradient measures 3.0 mmHg.  8. The inferior vena cava is normal in size with greater than 50% respiratory variability, suggesting right atrial pressure of 3 mmHg.  9. Bradycardia, rate 44 bpm 10. Right atrial size was mildly dilated. FINDINGS  Left Ventricle: Left ventricular ejection fraction, by estimation, is 50 to 55%. The left ventricle has low normal function. The left ventricle has no regional wall motion abnormalities. The left ventricular internal cavity size was normal in size. There is moderate left ventricular hypertrophy. Left ventricular diastolic parameters are indeterminate. Right Ventricle: The right ventricular size is moderately enlarged. No increase in right ventricular wall thickness. Right ventricular systolic function is mildly reduced. There is  normal pulmonary artery systolic pressure. The tricuspid regurgitant  velocity is 2.23 m/s, and with an assumed right atrial pressure of 5 mmHg, the estimated right ventricular systolic pressure is 51.0 mmHg. Left Atrium: Left atrial size was mildly dilated. Right Atrium: Right atrial size was mildly dilated. Pericardium: There is no evidence of pericardial effusion. Mitral Valve: The mitral valve is normal in structure. Moderate mitral valve regurgitation. No evidence of mitral valve stenosis. Tricuspid Valve: The tricuspid valve is normal in structure. Tricuspid valve regurgitation is moderate . No evidence of tricuspid stenosis. Aortic Valve: The aortic valve is tricuspid. Aortic valve regurgitation is not visualized. Aortic valve sclerosis/calcification is present, without any evidence of aortic stenosis. Aortic valve mean gradient measures 3.0 mmHg. Aortic valve peak gradient measures 5.5 mmHg. Aortic valve area, by VTI measures 2.43 cm. Pulmonic Valve: The pulmonic valve was normal in structure. Pulmonic valve regurgitation is mild. No evidence of pulmonic stenosis. Aorta: The aortic root is normal in size and structure. Venous: The inferior vena cava is normal in size with greater than 50% respiratory variability, suggesting right atrial pressure of 3 mmHg. IAS/Shunts: No atrial level shunt detected by color flow Doppler. Additional Comments: There is a moderate pleural effusion in the left lateral region.  LEFT VENTRICLE PLAX 2D LVIDd:         3.10 cm   Diastology LV PW:         1.00 cm   LV e' medial: 8.38 cm/s LV IVS:        1.40 cm LVOT diam:     1.90 cm LV SV:         54 LV SV Index:   30 LVOT Area:     2.84 cm  LEFT ATRIUM           Index        RIGHT ATRIUM           Index LA diam:      4.40 cm 2.46 cm/m   RA Area:     20.00 cm LA Vol (A2C): 37.0 ml 20.71 ml/m  RA Volume:   63.40 ml  35.48 ml/m LA Vol (A4C): 50.4 ml 28.21 ml/m  AORTIC VALVE AV Area (Vmax):    1.85 cm AV Area (Vmean):   1.64 cm AV Area (VTI):     2.43 cm AV Vmax:           117.00 cm/s AV Vmean:           83.400 cm/s AV VTI:            0.224 m AV Peak Grad:      5.5 mmHg AV Mean Grad:      3.0 mmHg LVOT Vmax:         76.50 cm/s LVOT Vmean:        48.200 cm/s LVOT VTI:          0.192 m LVOT/AV VTI ratio: 0.86  AORTA Ao Root diam: 3.20 cm MITRAL VALVE             TRICUSPID VALVE MV Area (PHT): 3.46 cm  TR Peak grad:   19.9 mmHg MV Decel Time: 219 msec  TR Vmax:        223.00 cm/s                           SHUNTS  Systemic VTI:  0.19 m                          Systemic Diam: 1.90 cm Ida Rogue MD Electronically signed by Ida Rogue MD Signature Date/Time: 06/06/2022/2:07:20 PM    Final     Cardiac Studies     Patient Profile     88 y.o. male with a h/o HFpEF, BPH and prostate cancer who is being seen for lower leg edema, acute renal failure  Assessment & Plan    Acute renal failure obstructive uropathy from BPH, prostate cancer Creatinine greater than 3 at the end of August 2023 Foley catheter placed on arrival with good output, 1.8 L out Creatinine used to trend upwards, greater than 5, likely component of ATN Diuretic on hold  Nephrology following   Bradycardia/junctional rhythm Bradycardia previously noted by outpatient cardiology at [facility] prior [facility] monitor completed Yesterday with No clear P waves , concern for junctional rhythm Today P waves noted, rates remain in the 40s, blood pressure stable We will need to monitor closely in the setting of acute renal failure   Anemia Hemoccult negative Iron 37 Possibly exacerbated in the setting of renal failure Will need outpatient follow-up with hematology   Leg edema Reports predominant unilateral leg swelling worse on the right than the left Noted over the past several weeks to months Recent concern for obstructive uropathy  Received IV diuretics with primary care earlier this week   Total encounter time more than 35 minutes  Greater than 50% was spent in counseling and coordination of care with  the patient   For questions or updates, please contact [place] Please consult [email or web address] for contact info under        Signed, Ida Rogue, MD  06/07/2022, 10:48 AM

## 2022-06-08 DIAGNOSIS — I10 Essential (primary) hypertension: Secondary | ICD-10-CM | POA: Diagnosis not present

## 2022-06-08 DIAGNOSIS — N179 Acute kidney failure, unspecified: Secondary | ICD-10-CM | POA: Diagnosis not present

## 2022-06-08 DIAGNOSIS — N1832 Chronic kidney disease, stage 3b: Secondary | ICD-10-CM | POA: Diagnosis not present

## 2022-06-08 DIAGNOSIS — I5031 Acute diastolic (congestive) heart failure: Secondary | ICD-10-CM

## 2022-06-08 DIAGNOSIS — N401 Enlarged prostate with lower urinary tract symptoms: Secondary | ICD-10-CM | POA: Diagnosis not present

## 2022-06-08 LAB — BASIC METABOLIC PANEL
Anion gap: 14 (ref 5–15)
BUN: 94 mg/dL — ABNORMAL HIGH (ref 8–23)
CO2: 21 mmol/L — ABNORMAL LOW (ref 22–32)
Calcium: 8.8 mg/dL — ABNORMAL LOW (ref 8.9–10.3)
Chloride: 88 mmol/L — ABNORMAL LOW (ref 98–111)
Creatinine, Ser: 5.51 mg/dL — ABNORMAL HIGH (ref 0.61–1.24)
GFR, Estimated: 9 mL/min — ABNORMAL LOW (ref >60)
Glucose, Bld: 100 mg/dL — ABNORMAL HIGH (ref 70–99)
Potassium: 4.5 mmol/L (ref 3.5–5.1)
Sodium: 123 mmol/L — ABNORMAL LOW (ref 135–145)

## 2022-06-08 NOTE — TOC Initial Note (Signed)
Transition of Care [facility]) - Initial/Assessment Note    Patient Details  Name: Aaron Grimes MRN: [id number] Date of Birth: 06/02/1930  Transition of Care [facility]) CM/SW Contact:    Aaron Price, RN Phone Number: 06/08/2022, 1:26 PM  Clinical Narrative:  9/16: Spoke with patient. Come from PCP's office with abnormal labs with worsening AKI/leg edema. Patient has PCP. Dr. Belinda Grimes, and cardiology by Aaron Grimes, AGNP-C at [facility]. RX at [facility] in [place], [place]. Patient lives in [place], [place]. Daughter assist with transportation and will transport home.  Patient declares no issue obtaining for paying for Medications and believes he Med Part D. Spouse has multiple DME, but patient does need a RW for himself. Aaron Davies RN CM                Patient Goals and CMS Choice        Expected Discharge Plan and Services                                                Prior Living Arrangements/Services                       Activities of Daily Living Home Assistive Devices/Equipment: Cane (specify quad or straight) ADL Screening (condition at time of admission) Patient's cognitive ability adequate to safely complete daily activities?: Yes Is the patient deaf or have difficulty hearing?: Yes Does the patient have difficulty seeing, even when wearing glasses/contacts?: No Does the patient have difficulty concentrating, remembering, or making decisions?: No Patient able to express need for assistance with ADLs?: Yes Does the patient have difficulty dressing or bathing?: No Independently performs ADLs?: Yes (appropriate for developmental age) Does the patient have difficulty walking or climbing stairs?: No Weakness of Legs: None Weakness of Arms/Hands: None  Permission Sought/Granted                  Emotional Assessment              Admission diagnosis:  AKI (acute kidney injury) ([place]) [N17.9] Patient Active Problem List    Diagnosis Date Noted   Obstructive uropathy    Anemia due to stage 3b chronic kidney disease ([place])    Heart failure with preserved ejection fraction ([place])    Iron deficiency anemia    AKI (acute kidney injury) ([place]) 06/01/2022   Hypokalemia 06/08/2022   Essential hypertension 06/07/2022   BPH with obstruction/lower urinary tract symptoms 08/24/2015   Elevated PSA 08/24/2015   PCP:  Lenard Simmer, MD Pharmacy:   [place], [place] - [address] [place] [address] Phone: [phone] Fax: [phone]     Social Determinants of Health (SDOH) Interventions    Readmission Risk Interventions     No data to display

## 2022-06-08 NOTE — Progress Notes (Signed)
Rounding Note    Patient Name: Aaron Grimes Date of Encounter: 06/08/2022  [place] Cardiologist:  New  Subjective   Creatinine continues to worsen to 5.51 today.NA 123. Question of starting dialysis. Patient is still making urine. He denies chest pain or SOB. LLE noted on exam.   Inpatient Medications    Scheduled Meds:  Chlorhexidine Gluconate Cloth  6 each Topical Daily   finasteride  5 mg Oral QPM   heparin  5,000 Units Subcutaneous Q8H   polyethylene glycol  17 g Oral Daily   rosuvastatin  20 mg Oral QHS   cyanocobalamin  100 mcg Oral Daily   Continuous Infusions:  PRN Meds: acetaminophen **OR** acetaminophen, hydrALAZINE, ondansetron **OR** ondansetron (ZOFRAN) IV, mouth rinse, oxyCODONE, senna-docusate   Vital Signs    Vitals:   06/07/22 2106 06/08/22 0006 06/08/22 0008 06/08/22 0432  BP: (!) 106/51 (!) 123/92  (!) 103/52  Pulse: (!) 40 (!) 39 (!) 42 95  Resp: '18 19  16  '$ Temp: 97.8 F (36.6 C) 98 F (36.7 C)  97.8 F (36.6 C)  TempSrc: Oral Oral  Oral  SpO2: 92% 97% 98% 100%  Weight:      Height:        Intake/Output Summary (Last 24 hours) at 06/08/2022 0932 Last data filed at 06/08/2022 0600 Gross per 24 hour  Intake 720 ml  Output 550 ml  Net 170 ml      06/22/2022    2:00 PM 06/14/2022    9:28 AM 04/09/2022   11:19 AM  Last 3 Weights  Weight (lbs) 145 lb 12.8 oz 147 lb 0.8 oz 147 lb  Weight (kg) 66.134 kg 66.7 kg 66.679 kg      Telemetry    Junctional rhythm, HR 40s upper 30s, PVCs - Personally Reviewed  ECG    No new - Personally Reviewed  Physical Exam   GEN: No acute distress.   Neck: No JVD Cardiac: RR, bradycardic, no murmurs, rubs, or gallops.  Respiratory: Clear to auscultation bilaterally. GI: Soft, nontender, non-distended  MS: lower leg edema; No deformity. Neuro:  Nonfocal  Psych: Normal affect   Labs    High Sensitivity Troponin:   Recent Labs  Lab 06/03/2022 0945 06/08/2022 1147   TROPONINIHS 245* 239*     Chemistry Recent Labs  Lab 06/16/2022 0945 06/05/22 0625 06/06/22 0536 06/07/22 0505 06/08/22 0728  NA 126*   < > 123* 124* 123*  K 3.0*   < > 4.1 4.1 4.5  CL 88*   < > 89* 89* 88*  CO2 23   < > 22 22 21*  GLUCOSE 124*   < > 118* 98 100*  BUN 79*   < > 87* 91* 94*  CREATININE 4.65*   < > 5.01* 5.18* 5.51*  CALCIUM 8.9   < > 8.6* 8.7* 8.8*  MG 2.5*  --   --   --   --   PROT 5.9*  --   --   --   --   ALBUMIN 3.0*  --   --   --   --   AST 80*  --   --   --   --   ALT 43  --   --   --   --   ALKPHOS 96  --   --   --   --   BILITOT 0.8  --   --   --   --   GFRNONAA 11*   < >  10* 10* 9*  ANIONGAP 15   < > '12 13 14   '$ < > = values in this interval not displayed.    Lipids No results for input(s): "CHOL", "TRIG", "HDL", "LABVLDL", "LDLCALC", "CHOLHDL" in the last 168 hours.  Hematology Recent Labs  Lab 06/05/22 0625 06/06/22 0536 06/07/22 0505  WBC 5.5 7.0 6.4  RBC 2.44* 2.64* 2.63*  HGB 7.8* 8.3* 8.5*  HCT 22.1* 23.8* 24.1*  MCV 90.6 90.2 91.6  MCH 32.0 31.4 32.3  MCHC 35.3 34.9 35.3  RDW 15.9* 16.0* 16.3*  PLT 161 176 203   Thyroid  Recent Labs  Lab 06/22/2022 0945  TSH 2.501    BNP Recent Labs  Lab 05/28/2022 0945  BNP 2,819.7*    DDimer No results for input(s): "DDIMER" in the last 168 hours.   Radiology    ECHOCARDIOGRAM COMPLETE  Result Date: 06/06/2022    ECHOCARDIOGRAM REPORT   Patient Name:   Aaron Grimes Date of Exam: 06/06/2022 Medical Rec #:  [id number]             Height:       68.0 in Accession #:    [id number]            Weight:       145.8 lb Date of Birth:  01/18/1930            BSA:          1.787 m Patient Age:    58 years              BP:           111/48 mmHg Patient Gender: M                     HR:           40 bpm. Exam Location:  [facility] Procedure: 2D Echo, Cardiac Doppler and Color Doppler Indications:     CHF-acute diastolic P61.95  History:         Patient has no prior history of Echocardiogram examinations.                   Risk Factors:Dyslipidemia and Hypertension.  Sonographer:     Sherrie Sport Referring Phys:  Minna Merritts Diagnosing Phys: Ida Rogue MD  Sonographer Comments: Suboptimal apical window. IMPRESSIONS  1. Left ventricular ejection fraction, by estimation, is 50 to 55%. The left ventricle has low normal function. The left ventricle has no regional wall motion abnormalities. There is moderate left ventricular hypertrophy. Left ventricular diastolic parameters are indeterminate.  2. Right ventricular systolic function is mildly reduced. The right ventricular size is moderately enlarged. There is normal pulmonary artery systolic pressure. The estimated right ventricular systolic pressure is 09.3 mmHg.  3. Left atrial size was mildly dilated.  4. Moderate pleural effusion in the left lateral region.  5. The mitral valve is normal in structure. Moderate mitral valve regurgitation. No evidence of mitral stenosis.  6. Tricuspid valve regurgitation is moderate.  7. The aortic valve is tricuspid. Aortic valve regurgitation is not visualized. Aortic valve sclerosis/calcification is present, without any evidence of aortic stenosis. Aortic valve mean gradient measures 3.0 mmHg.  8. The inferior vena cava is normal in size with greater than 50% respiratory variability, suggesting right atrial pressure of 3 mmHg.  9. Bradycardia, rate 44 bpm 10. Right atrial size was mildly dilated. FINDINGS  Left Ventricle: Left ventricular ejection fraction, by estimation, is 50 to  55%. The left ventricle has low normal function. The left ventricle has no regional wall motion abnormalities. The left ventricular internal cavity size was normal in size. There is moderate left ventricular hypertrophy. Left ventricular diastolic parameters are indeterminate. Right Ventricle: The right ventricular size is moderately enlarged. No increase in right ventricular wall thickness. Right ventricular systolic function is mildly reduced.  There is normal pulmonary artery systolic pressure. The tricuspid regurgitant velocity is 2.23 m/s, and with an assumed right atrial pressure of 5 mmHg, the estimated right ventricular systolic pressure is 16.1 mmHg. Left Atrium: Left atrial size was mildly dilated. Right Atrium: Right atrial size was mildly dilated. Pericardium: There is no evidence of pericardial effusion. Mitral Valve: The mitral valve is normal in structure. Moderate mitral valve regurgitation. No evidence of mitral valve stenosis. Tricuspid Valve: The tricuspid valve is normal in structure. Tricuspid valve regurgitation is moderate . No evidence of tricuspid stenosis. Aortic Valve: The aortic valve is tricuspid. Aortic valve regurgitation is not visualized. Aortic valve sclerosis/calcification is present, without any evidence of aortic stenosis. Aortic valve mean gradient measures 3.0 mmHg. Aortic valve peak gradient measures 5.5 mmHg. Aortic valve area, by VTI measures 2.43 cm. Pulmonic Valve: The pulmonic valve was normal in structure. Pulmonic valve regurgitation is mild. No evidence of pulmonic stenosis. Aorta: The aortic root is normal in size and structure. Venous: The inferior vena cava is normal in size with greater than 50% respiratory variability, suggesting right atrial pressure of 3 mmHg. IAS/Shunts: No atrial level shunt detected by color flow Doppler. Additional Comments: There is a moderate pleural effusion in the left lateral region.  LEFT VENTRICLE PLAX 2D LVIDd:         3.10 cm   Diastology LV PW:         1.00 cm   LV e' medial: 8.38 cm/s LV IVS:        1.40 cm LVOT diam:     1.90 cm LV SV:         54 LV SV Index:   30 LVOT Area:     2.84 cm  LEFT ATRIUM           Index        RIGHT ATRIUM           Index LA diam:      4.40 cm 2.46 cm/m   RA Area:     20.00 cm LA Vol (A2C): 37.0 ml 20.71 ml/m  RA Volume:   63.40 ml  35.48 ml/m LA Vol (A4C): 50.4 ml 28.21 ml/m  AORTIC VALVE AV Area (Vmax):    1.85 cm AV Area (Vmean):    1.64 cm AV Area (VTI):     2.43 cm AV Vmax:           117.00 cm/s AV Vmean:          83.400 cm/s AV VTI:            0.224 m AV Peak Grad:      5.5 mmHg AV Mean Grad:      3.0 mmHg LVOT Vmax:         76.50 cm/s LVOT Vmean:        48.200 cm/s LVOT VTI:          0.192 m LVOT/AV VTI ratio: 0.86  AORTA Ao Root diam: 3.20 cm MITRAL VALVE             TRICUSPID VALVE MV Area (PHT): 3.46 cm  TR  Peak grad:   19.9 mmHg MV Decel Time: 219 msec  TR Vmax:        223.00 cm/s                           SHUNTS                          Systemic VTI:  0.19 m                          Systemic Diam: 1.90 cm Ida Rogue MD Electronically signed by Ida Rogue MD Signature Date/Time: 06/06/2022/2:07:20 PM    Final     Cardiac Studies   Echo 02/2022 Summary    1. The left ventricle is normal in size with mildly increased wall  thickness.    2. The left ventricular systolic function is normal, LVEF is visually  estimated at 60-65%.    3. There is grade III diastolic dysfunction (severely elevated filling  pressure).    4. There is mild mitral valve regurgitation.    5. The left atrium is mildly dilated in size.    6. The aortic valve is trileaflet with mildly thickened leaflets with normal  excursion.    7. The right ventricle is upper normal in size, with normal systolic  function.    8. There is mild pulmonary hypertension.    9. Estimated pulmonary arterial systolic pressure is 49 mmHg.    10. The right atrium is mildly dilated  in size.    11. IVC size and inspiratory change suggest elevated right atrial pressure.  (10-20 mmHg).    12. Bilateral pleural effusions.    Myoview Lexsican 01/2022 IMPRESSIONS:  __________________________________________________________________  - Normal myocardial perfusion study  - No evidence of any significant ischemia or scar  - Left ventricular systolic function is normal. Post stress the ejection  fraction is > 60%.  - Mild coronary calcifications are noted  -  Incidentally noted on the attenuation CT scan mesenteric calcifications  and gallstones     Patient Profile     88 y.o. male with a h/o HFpEF, BPH and prostate cancer who is being seen for lower leg edema, acute renal failure  Assessment & Plan    HFpEF/Leg edema - PTA he was on and off Torsemide - Diuretics held with worsening kidney function>continues to worsen - Echo 02/2022 showed LVEF 60-65%, G3DD, mild MR, mild pulmonary hypertension, mildly dilated RA - continue to hold diuretics and trend kidney function. Suspected worsening kidney function from obstruction. Nephrology and urology following, may need dialysis   Elevated troponin - no chest pain reported - suspected demand ischemia - Myoview Lexiscan showed no ischemia or scar, mild coronary calcifications are noted - not a candidate for invasive procedure given anemia and kidney dysfunction - ASA held for anemia - continue Lipitor  Bradycardia/Junctional rhythm  - appears to be in a junctional rhythm with HR upper 30s and 40s - Keep Mag>2 and K>4 - TSH wnl - not on rate limiting meds at baseline - continue to monitor tele   Iron deficiency anemia - ASA held - Fecal occult ordered - Hgb 7-8   AKI H/o BPH/metastatic prostate cancer Hyponatremia - obstructive uropathy 2/2 BPH and prostate cancer - s/p foley with significant output - Creatinine continues to worsen, still making urine - baseline Scr 1.5 from August 2023 - nephrology and  urology following - May need HD, however plan on waiting until Monday for Palliative consult given metastatic prostate cancer.   For questions or updates, please contact [place] Please consult [email or web address] for contact info under        Signed, Xoe Hoe Ninfa Meeker, PA-C  06/08/2022, 9:32 AM

## 2022-06-08 NOTE — Progress Notes (Signed)
PROGRESS NOTE    Aaron Grimes  DPO:[id number] DOB: 1930/03/29 DOA: 05/30/2022 PCP: Lenard Simmer, MD   Brief Narrative:  Patient is a 88 year old male with history of neuropathy, hypertension, hyperlipidemia, BPH, prostate cancer who presented after his PCP referred him for the evaluation of abnormal labs.  On presentation ,he was hemodynamically stable.  Lab work showed sodium of 126, potassium of 3, creatinine of 4.65.  BNP was elevated.  Patient reported worsening shortness of breath, lower extremity swelling, decreased urine output for the last 2 weeks.  CT renal study showed bilateral perinephric stranding without hydronephrosis .urine retention was suspected and Foley was placed.  Nephrology consulted for severe worsening AKI.  Currently being monitored without dialysis  Assessment & Plan:   AKI:  -Could be secondary to aggressive diuresis?  CT renal study showed bilateral perinephric stranding without hydronephrosis  -urine retention was suspected and Foley was placed.   -Nephrology consulted-appreciate help -Renal function continues to get worse despite holding diuresis.  No indication of emergent dialysis at this time. -We will continue to monitor his kidney function.  Avoid nephrotoxic medications. -We will consult palliative care to establish goals of care with the patient   Chronic diastolic congestive heart failure-leg edema:  -Echo done on 03/19/2022 showed EF of 60 to 43%, grade 2 diastolic dysfunction.  -on torsemide  at home.elevated BNP.  Cardio  following here.  New echo showed EF of 50 to 55%, indeterminate left ventricular diastolic parameters -Continue to hold diuretics in the setting of worsening kidney function. -Doppler ultrasound of bilateral lower extremity resulted negative for DVT.  Continue compression stockings.  Continue strict INO's and daily weight.  Bradycardia/junctional rhythm: -Heart rate noted to be in 30s and 40s. -Continue to monitor  closely on telemetry.  Keep magnesium above 2, potassium above 4.  TSH: WNL.  Hypertension: Home lisinopril on hold due to AKI. -continue prn Medications for severe hypertension.  Currently blood pressure soft   History of hyperlipidemia: Continue rosuvastatin   History of carotid artery disease: On aspirin at home, now on hold   Normocytic anemia: No evidence of acute blood loss.  Hemoglobin currently in the range of 7-8.  Hemoglobin was 10.4 on 10/23/2021.  Patient taking iron pill.  He reports black stools but attributes that to iron pills.  Iron studies showed low iron, given a dose of IV iron.  Negative FOBT.   Hyponatremia: This is most likely from volume overload.  Continue to monitor.  Elevated troponin: This is likely from demand ischemia.  Troponin significantly high with flat trend.  Denies any chest pain.  Cardiology following -Patient denies any chest pain.  Aspirin on hold.  Myoview Lexiscan showed no ischemia or scar, mild coronary calcification are noted.  Not a candidate for invasive procedure given AKI.   History BPH/prostate cancer: Continue finasteride.  He follows with urology.  Bone scan on 8/23 did not show any skeletal metastatic disease.  CT abdomen/pelvis done on 04/23/2022 showed small to borderline enlarged abdominal/pelvic retroperitoneal lymph nodes indicative of metastatic disease.   -Consulted urology-recommended outpatient follow-up   Debility/deconditioning: PT/OT consulted recommended home health PT/OT.  Patient is increased risk for morbidity and mortality due to worsening kidney function and other multiple medical problems.  I will consult palliative care to discuss goals of care with patient.  DVT prophylaxis: Heparin Code Status: DNR Family Communication: None present at bedside.  Plan of care discussed with patient in length and he verbalized understanding and agreed with  it. Disposition Plan: To be determined  Consultants:   Nephrology Cardiology Palliative care  Procedures:  None  Antimicrobials:  None  Status is: Inpatient    Subjective: Patient seen and examined.  Sitting comfortably on the recliner, eating breakfast.  Reports that he is doing well, no new complaints.  No nausea, vomiting, remained afebrile overnight.  He denies any chest pain or shortness of breath.  No acute events overnight.  Objective: Vitals:   06/08/22 0006 06/08/22 0008 06/08/22 0432 06/08/22 1000  BP: (!) 123/92  (!) 103/52 (!) 129/54  Pulse: (!) 39 (!) 42 95 (!) 38  Resp: '19  16 15  '$ Temp: 98 F (36.7 C)  97.8 F (36.6 C) (!) 97.4 F (36.3 C)  TempSrc: Oral  Oral Oral  SpO2: 97% 98% 100% 100%  Weight:      Height:        Intake/Output Summary (Last 24 hours) at 06/08/2022 1229 Last data filed at 06/08/2022 0600 Gross per 24 hour  Intake 480 ml  Output 550 ml  Net -70 ml   Filed Weights   06/15/2022 0928 05/30/2022 1400  Weight: 66.7 kg 66.1 kg    Examination:  General exam: Appears calm and comfortable on room air, communicating well, sitting comfortably on recliner and eating breakfast Respiratory system: Clear to auscultation. Respiratory effort normal. Cardiovascular system: S1 & S2 heard, RRR. No JVD, murmurs, rubs, gallops or clicks.  biLateral 2+ pitting edema positive.  Has compression stockings gastrointestinal system: Abdomen is nondistended, soft and nontender. No organomegaly or masses felt. Normal bowel sounds heard. Central nervous system: Alert and oriented. No focal neurological deficits. Extremities: Symmetric 5 x 5 power. Skin: No rashes, lesions or ulcers Psychiatry: Judgement and insight appear normal. Mood & affect appropriate.    Data Reviewed: I have personally reviewed following labs and imaging studies  CBC: Recent Labs  Lab 06/22/2022 0945 06/05/22 0625 06/06/22 0536 06/07/22 0505  WBC 7.6 5.5 7.0 6.4  NEUTROABS 5.9  --   --   --   HGB 8.6* 7.8* 8.3* 8.5*  HCT 25.1* 22.1*  23.8* 24.1*  MCV 92.3 90.6 90.2 91.6  PLT 185 161 176 867   Basic Metabolic Panel: Recent Labs  Lab 06/08/2022 0945 06/05/22 0625 06/06/22 0536 06/07/22 0505 06/08/22 0728  NA 126* 126* 123* 124* 123*  K 3.0* 4.1 4.1 4.1 4.5  CL 88* 92* 89* 89* 88*  CO2 23 21* 22 22 21*  GLUCOSE 124* 119* 118* 98 100*  BUN 79* 81* 87* 91* 94*  CREATININE 4.65* 4.81* 5.01* 5.18* 5.51*  CALCIUM 8.9 8.6* 8.6* 8.7* 8.8*  MG 2.5*  --   --   --   --    GFR: Estimated Creatinine Clearance: 8.5 mL/min (A) (by C-G formula based on SCr of 5.51 mg/dL (H)). Liver Function Tests: Recent Labs  Lab 05/26/2022 0945  AST 80*  ALT 43  ALKPHOS 96  BILITOT 0.8  PROT 5.9*  ALBUMIN 3.0*   No results for input(s): "LIPASE", "AMYLASE" in the last 168 hours. No results for input(s): "AMMONIA" in the last 168 hours. Coagulation Profile: No results for input(s): "INR", "PROTIME" in the last 168 hours. Cardiac Enzymes: No results for input(s): "CKTOTAL", "CKMB", "CKMBINDEX", "TROPONINI" in the last 168 hours. BNP (last 3 results) No results for input(s): "PROBNP" in the last 8760 hours. HbA1C: No results for input(s): "HGBA1C" in the last 72 hours. CBG: No results for input(s): "GLUCAP" in the last 168 hours. Lipid Profile:  No results for input(s): "CHOL", "HDL", "LDLCALC", "TRIG", "CHOLHDL", "LDLDIRECT" in the last 72 hours. Thyroid Function Tests: No results for input(s): "TSH", "T4TOTAL", "FREET4", "T3FREE", "THYROIDAB" in the last 72 hours. Anemia Panel: No results for input(s): "VITAMINB12", "FOLATE", "FERRITIN", "TIBC", "IRON", "RETICCTPCT" in the last 72 hours. Sepsis Labs: No results for input(s): "PROCALCITON", "LATICACIDVEN" in the last 168 hours.  Recent Results (from the past 240 hour(s))  Urine Culture     Status: None   Collection Time: 06/17/2022 12:14 PM   Specimen: Urine, Clean Catch  Result Value Ref Range Status   Specimen Description   Final    URINE, CLEAN CATCH Performed at [facility], [address], [place], [place] [address]    Special Requests   Final    NONE Performed at [facility], [address], [place], [place] [address]    Culture   Final    NO GROWTH Performed at [facility], [place] [address], [place], [place] [address]    Report Status 06/05/2022 FINAL  Final      Radiology Studies: ECHOCARDIOGRAM COMPLETE  Result Date: 06/06/2022    ECHOCARDIOGRAM REPORT   Patient Name:   Icker Swigert Date of Exam: 06/06/2022 Medical Rec #:  [id number]             Height:       68.0 in Accession #:    [id number]            Weight:       145.8 lb Date of Birth:  March 31, 1930            BSA:          1.787 m Patient Age:    9 years              BP:           111/48 mmHg Patient Gender: M                     HR:           40 bpm. Exam Location:  [facility] Procedure: 2D Echo, Cardiac Doppler and Color Doppler Indications:     CHF-acute diastolic Y09.98  History:         Patient has no prior history of Echocardiogram examinations.                  Risk Factors:Dyslipidemia and Hypertension.  Sonographer:     Sherrie Sport Referring Phys:  Minna Merritts Diagnosing Phys: Ida Rogue MD  Sonographer Comments: Suboptimal apical window. IMPRESSIONS  1. Left ventricular ejection fraction, by estimation, is 50 to 55%. The left ventricle has low normal function. The left ventricle has no regional wall motion abnormalities. There is moderate left ventricular hypertrophy. Left ventricular diastolic parameters are indeterminate.  2. Right ventricular systolic function is mildly reduced. The right ventricular size is moderately enlarged. There is normal pulmonary artery systolic pressure. The estimated right ventricular systolic pressure is 33.8 mmHg.  3. Left atrial size was mildly dilated.  4. Moderate pleural effusion in the left lateral region.  5. The mitral valve is normal in structure. Moderate mitral valve regurgitation. No evidence of mitral stenosis.  6.  Tricuspid valve regurgitation is moderate.  7. The aortic valve is tricuspid. Aortic valve regurgitation is not visualized. Aortic valve sclerosis/calcification is present, without any evidence of aortic stenosis. Aortic valve mean gradient measures 3.0 mmHg.  8. The inferior vena cava  is normal in size with greater than 50% respiratory variability, suggesting right atrial pressure of 3 mmHg.  9. Bradycardia, rate 44 bpm 10. Right atrial size was mildly dilated. FINDINGS  Left Ventricle: Left ventricular ejection fraction, by estimation, is 50 to 55%. The left ventricle has low normal function. The left ventricle has no regional wall motion abnormalities. The left ventricular internal cavity size was normal in size. There is moderate left ventricular hypertrophy. Left ventricular diastolic parameters are indeterminate. Right Ventricle: The right ventricular size is moderately enlarged. No increase in right ventricular wall thickness. Right ventricular systolic function is mildly reduced. There is normal pulmonary artery systolic pressure. The tricuspid regurgitant velocity is 2.23 m/s, and with an assumed right atrial pressure of 5 mmHg, the estimated right ventricular systolic pressure is 09.3 mmHg. Left Atrium: Left atrial size was mildly dilated. Right Atrium: Right atrial size was mildly dilated. Pericardium: There is no evidence of pericardial effusion. Mitral Valve: The mitral valve is normal in structure. Moderate mitral valve regurgitation. No evidence of mitral valve stenosis. Tricuspid Valve: The tricuspid valve is normal in structure. Tricuspid valve regurgitation is moderate . No evidence of tricuspid stenosis. Aortic Valve: The aortic valve is tricuspid. Aortic valve regurgitation is not visualized. Aortic valve sclerosis/calcification is present, without any evidence of aortic stenosis. Aortic valve mean gradient measures 3.0 mmHg. Aortic valve peak gradient measures 5.5 mmHg. Aortic valve area, by VTI  measures 2.43 cm. Pulmonic Valve: The pulmonic valve was normal in structure. Pulmonic valve regurgitation is mild. No evidence of pulmonic stenosis. Aorta: The aortic root is normal in size and structure. Venous: The inferior vena cava is normal in size with greater than 50% respiratory variability, suggesting right atrial pressure of 3 mmHg. IAS/Shunts: No atrial level shunt detected by color flow Doppler. Additional Comments: There is a moderate pleural effusion in the left lateral region.  LEFT VENTRICLE PLAX 2D LVIDd:         3.10 cm   Diastology LV PW:         1.00 cm   LV e' medial: 8.38 cm/s LV IVS:        1.40 cm LVOT diam:     1.90 cm LV SV:         54 LV SV Index:   30 LVOT Area:     2.84 cm  LEFT ATRIUM           Index        RIGHT ATRIUM           Index LA diam:      4.40 cm 2.46 cm/m   RA Area:     20.00 cm LA Vol (A2C): 37.0 ml 20.71 ml/m  RA Volume:   63.40 ml  35.48 ml/m LA Vol (A4C): 50.4 ml 28.21 ml/m  AORTIC VALVE AV Area (Vmax):    1.85 cm AV Area (Vmean):   1.64 cm AV Area (VTI):     2.43 cm AV Vmax:           117.00 cm/s AV Vmean:          83.400 cm/s AV VTI:            0.224 m AV Peak Grad:      5.5 mmHg AV Mean Grad:      3.0 mmHg LVOT Vmax:         76.50 cm/s LVOT Vmean:        48.200 cm/s LVOT VTI:  0.192 m LVOT/AV VTI ratio: 0.86  AORTA Ao Root diam: 3.20 cm MITRAL VALVE             TRICUSPID VALVE MV Area (PHT): 3.46 cm  TR Peak grad:   19.9 mmHg MV Decel Time: 219 msec  TR Vmax:        223.00 cm/s                           SHUNTS                          Systemic VTI:  0.19 m                          Systemic Diam: 1.90 cm Ida Rogue MD Electronically signed by Ida Rogue MD Signature Date/Time: 06/06/2022/2:07:20 PM    Final     Scheduled Meds:  Chlorhexidine Gluconate Cloth  6 each Topical Daily   finasteride  5 mg Oral QPM   heparin  5,000 Units Subcutaneous Q8H   polyethylene glycol  17 g Oral Daily   rosuvastatin  20 mg Oral QHS   cyanocobalamin   100 mcg Oral Daily   Continuous Infusions:   LOS: 4 days   Time spent: 35 minutes   Aurielle Slingerland Loann Quill, MD [facility]  If 7PM-7AM, please contact night-coverage [email or web address] 06/08/2022, 12:29 PM

## 2022-06-08 NOTE — Progress Notes (Signed)
[facility], [place] 06/08/22  Subjective:   Hospital day # 4  Patient seen sitting at side of bed Alert and oriented, feels well today Appetite slowly improving Lower extremity edema improved Denies shortness of breath  Renal: 09/15 0701 - 09/16 0700 In: 720 [P.O.:720] Out: 1050 [Urine:1050] Lab Results  Component Value Date   CREATININE 5.51 (H) 06/08/2022   CREATININE 5.18 (H) 06/07/2022   CREATININE 5.01 (H) 06/06/2022     Objective:  Vital signs in last 24 hours:  Temp:  [95.5 F (35.3 C)-98.3 F (36.8 C)] 97.4 F (36.3 C) (09/16 1000) Pulse Rate:  [38-95] 38 (09/16 1000) Resp:  [15-19] 15 (09/16 1000) BP: (97-129)/(32-92) 129/54 (09/16 1000) SpO2:  [92 %-100 %] 100 % (09/16 1000)  Weight change:  Filed Weights   06/08/2022 0928 06/07/2022 1400  Weight: 66.7 kg 66.1 kg    Intake/Output:    Intake/Output Summary (Last 24 hours) at 06/08/2022 1131 Last data filed at 06/08/2022 0600 Gross per 24 hour  Intake 480 ml  Output 550 ml  Net -70 ml      Physical Exam: General: Sitting up at side of bed  HEENT Anicteric, moist oral mucous membranes  Pulm/lungs Normal breathing effort, clear to auscultation  CVS/Heart Regular, no rub  Abdomen:  Soft, nontender, nondistended  Extremities: Right greater than left 2-3+ pitting edema  Neurologic: Alert, oriented  Skin: No acute rashes   Foley in place       Basic Metabolic Panel:  Recent Labs  Lab 06/17/2022 0945 06/05/22 0625 06/06/22 0536 06/07/22 0505 06/08/22 0728  NA 126* 126* 123* 124* 123*  K 3.0* 4.1 4.1 4.1 4.5  CL 88* 92* 89* 89* 88*  CO2 23 21* 22 22 21*  GLUCOSE 124* 119* 118* 98 100*  BUN 79* 81* 87* 91* 94*  CREATININE 4.65* 4.81* 5.01* 5.18* 5.51*  CALCIUM 8.9 8.6* 8.6* 8.7* 8.8*  MG 2.5*  --   --   --   --       CBC: Recent Labs  Lab 06/18/2022 0945 06/05/22 0625 06/06/22 0536 06/07/22 0505  WBC 7.6 5.5 7.0 6.4  NEUTROABS 5.9  --   --   --   HGB  8.6* 7.8* 8.3* 8.5*  HCT 25.1* 22.1* 23.8* 24.1*  MCV 92.3 90.6 90.2 91.6  PLT 185 161 176 203      No results found for: "HEPBSAG", "HEPBSAB", "HEPBIGM"    Microbiology:  Recent Results (from the past 240 hour(s))  Urine Culture     Status: None   Collection Time: 05/26/2022 12:14 PM   Specimen: Urine, Clean Catch  Result Value Ref Range Status   Specimen Description   Final    URINE, CLEAN CATCH Performed at [facility], [address] [place], [place] [address]    Special Requests   Final    NONE Performed at [facility], [address], [place], [place] [address]    Culture   Final    NO GROWTH Performed at [facility], [place] [address] [place], [place] [address]    Report Status 06/05/2022 FINAL  Final    Coagulation Studies: No results for input(s): "LABPROT", "INR" in the last 72 hours.  Urinalysis: No results for input(s): "COLORURINE", "LABSPEC", "PHURINE", "GLUCOSEU", "HGBUR", "BILIRUBINUR", "KETONESUR", "PROTEINUR", "UROBILINOGEN", "NITRITE", "LEUKOCYTESUR" in the last 72 hours.  Invalid input(s): "APPERANCEUR"     Imaging: ECHOCARDIOGRAM COMPLETE  Result Date: 06/06/2022    ECHOCARDIOGRAM REPORT  Patient Name:   Aaron Grimes Date of Exam: 06/06/2022 Medical Rec #:  [id number]             Height:       68.0 in Accession #:    [id number]            Weight:       145.8 lb Date of Birth:  1930/07/06            BSA:          1.787 m Patient Age:    88 years              BP:           111/48 mmHg Patient Gender: M                     HR:           40 bpm. Exam Location:  [facility] Procedure: 2D Echo, Cardiac Doppler and Color Doppler Indications:     CHF-acute diastolic W80.88  History:         Patient has no prior history of Echocardiogram examinations.                  Risk Factors:Dyslipidemia and Hypertension.  Sonographer:     Sherrie Sport Referring Phys:  Minna Merritts Diagnosing Phys: Ida Rogue MD  Sonographer Comments:  Suboptimal apical window. IMPRESSIONS  1. Left ventricular ejection fraction, by estimation, is 50 to 55%. The left ventricle has low normal function. The left ventricle has no regional wall motion abnormalities. There is moderate left ventricular hypertrophy. Left ventricular diastolic parameters are indeterminate.  2. Right ventricular systolic function is mildly reduced. The right ventricular size is moderately enlarged. There is normal pulmonary artery systolic pressure. The estimated right ventricular systolic pressure is 11.0 mmHg.  3. Left atrial size was mildly dilated.  4. Moderate pleural effusion in the left lateral region.  5. The mitral valve is normal in structure. Moderate mitral valve regurgitation. No evidence of mitral stenosis.  6. Tricuspid valve regurgitation is moderate.  7. The aortic valve is tricuspid. Aortic valve regurgitation is not visualized. Aortic valve sclerosis/calcification is present, without any evidence of aortic stenosis. Aortic valve mean gradient measures 3.0 mmHg.  8. The inferior vena cava is normal in size with greater than 50% respiratory variability, suggesting right atrial pressure of 3 mmHg.  9. Bradycardia, rate 44 bpm 10. Right atrial size was mildly dilated. FINDINGS  Left Ventricle: Left ventricular ejection fraction, by estimation, is 50 to 55%. The left ventricle has low normal function. The left ventricle has no regional wall motion abnormalities. The left ventricular internal cavity size was normal in size. There is moderate left ventricular hypertrophy. Left ventricular diastolic parameters are indeterminate. Right Ventricle: The right ventricular size is moderately enlarged. No increase in right ventricular wall thickness. Right ventricular systolic function is mildly reduced. There is normal pulmonary artery systolic pressure. The tricuspid regurgitant velocity is 2.23 m/s, and with an assumed right atrial pressure of 5 mmHg, the estimated right ventricular  systolic pressure is 31.5 mmHg. Left Atrium: Left atrial size was mildly dilated. Right Atrium: Right atrial size was mildly dilated. Pericardium: There is no evidence of pericardial effusion. Mitral Valve: The mitral valve is normal in structure. Moderate mitral valve regurgitation. No evidence of mitral valve stenosis. Tricuspid Valve: The tricuspid valve is normal in structure. Tricuspid valve regurgitation is moderate . No evidence of tricuspid stenosis.  Aortic Valve: The aortic valve is tricuspid. Aortic valve regurgitation is not visualized. Aortic valve sclerosis/calcification is present, without any evidence of aortic stenosis. Aortic valve mean gradient measures 3.0 mmHg. Aortic valve peak gradient measures 5.5 mmHg. Aortic valve area, by VTI measures 2.43 cm. Pulmonic Valve: The pulmonic valve was normal in structure. Pulmonic valve regurgitation is mild. No evidence of pulmonic stenosis. Aorta: The aortic root is normal in size and structure. Venous: The inferior vena cava is normal in size with greater than 50% respiratory variability, suggesting right atrial pressure of 3 mmHg. IAS/Shunts: No atrial level shunt detected by color flow Doppler. Additional Comments: There is a moderate pleural effusion in the left lateral region.  LEFT VENTRICLE PLAX 2D LVIDd:         3.10 cm   Diastology LV PW:         1.00 cm   LV e' medial: 8.38 cm/s LV IVS:        1.40 cm LVOT diam:     1.90 cm LV SV:         54 LV SV Index:   30 LVOT Area:     2.84 cm  LEFT ATRIUM           Index        RIGHT ATRIUM           Index LA diam:      4.40 cm 2.46 cm/m   RA Area:     20.00 cm LA Vol (A2C): 37.0 ml 20.71 ml/m  RA Volume:   63.40 ml  35.48 ml/m LA Vol (A4C): 50.4 ml 28.21 ml/m  AORTIC VALVE AV Area (Vmax):    1.85 cm AV Area (Vmean):   1.64 cm AV Area (VTI):     2.43 cm AV Vmax:           117.00 cm/s AV Vmean:          83.400 cm/s AV VTI:            0.224 m AV Peak Grad:      5.5 mmHg AV Mean Grad:      3.0 mmHg  LVOT Vmax:         76.50 cm/s LVOT Vmean:        48.200 cm/s LVOT VTI:          0.192 m LVOT/AV VTI ratio: 0.86  AORTA Ao Root diam: 3.20 cm MITRAL VALVE             TRICUSPID VALVE MV Area (PHT): 3.46 cm  TR Peak grad:   19.9 mmHg MV Decel Time: 219 msec  TR Vmax:        223.00 cm/s                           SHUNTS                          Systemic VTI:  0.19 m                          Systemic Diam: 1.90 cm Ida Rogue MD Electronically signed by Ida Rogue MD Signature Date/Time: 06/06/2022/2:07:20 PM    Final      Medications:     Chlorhexidine Gluconate Cloth  6 each Topical Daily   finasteride  5 mg Oral QPM   heparin  5,000 Units Subcutaneous  Q8H   polyethylene glycol  17 g Oral Daily   rosuvastatin  20 mg Oral QHS   cyanocobalamin  100 mcg Oral Daily   acetaminophen **OR** acetaminophen, hydrALAZINE, ondansetron **OR** ondansetron (ZOFRAN) IV, mouth rinse, oxyCODONE, senna-docusate  Assessment/ Plan:  88 y.o. male with hypertension, BPH, hyperlipidemia, suspected prostate cancer   admitted on 05/27/2022 for AKI (acute kidney injury) ([place]) [N17.9]  #Acute kidney injury Unclear cause but likely secondary to aggressive diuresis.  Patient also had reported short-term nonsteroidal use.  Baseline creatinine of 1.5 from April 23, 2022.  No IV contrast exposure. Foley catheter remains.  Urine output just over 1 L recorded in preceding 24 hours.  Renal function continues to worsen despite held diuresis.  No acute need for dialysis as patient denies uremic symptoms.  If renal function does not improve, may have to consider dialysis.  Would recommend palliative care consult to establish goals of care considering patients current health and conditions.   #Hyponatremia Secondary to volume overload.  Sodium remains 123.  Continue fluid restriction 1200 mL  #Chronic diastolic CHF, Lower extremity edema As per cardiology note, patient has grade 3 diastolic dysfunction. Diuretic held Edema  improving.  We will continue to monitor closely.   LOS: 4 Esperanza Madrazo 9/16/202311:31 AM  [facility] [place], [place]

## 2022-06-08 NOTE — TOC Progression Note (Addendum)
Transition of Care [facility]) - Progression Note    Patient Details  Name: Aaron Grimes MRN: [id number] Date of Birth: 05-Dec-1929  [facility]) CM/SW Contact  Izola Price, RN Phone Number: 06/08/2022, 1:21 PM  Clinical Narrative:   Spoke with patient regarding [facility] PT recommendations and discharge planning and TOC role. Patient's spouse has had HH in past, but he cannot remember agency. Has some concerns about cost. Needs a RW though his wife has multiple other DME he is able to use. Daughter will transport to appointments and home on discharge. Requested HH and DME orders from provider. No issues obtaining or paying for medications prescribed per patient. PCP is Dr. Belinda Fisher, and RX is [facility] in [place], [place]. Patient lives in [place], [place]. Palliative consult ordered/pending. Barbie Jj Enyeart RN CM   145 pm. HH orders in for PT and RW for DME. DME ordered via [facility]. [facility] Corporal declined for [facility]. Barbie Jizel Cheeks RN CM   215 pm. [facility] accepted for [facility] but indicated patient's PCP has refused to provide orders for [facility] in the past. [name] RN CM        Expected Discharge Plan and Services                                                 Social Determinants of Health (SDOH) Interventions    Readmission Risk Interventions     No data to display

## 2022-06-09 DIAGNOSIS — N179 Acute kidney failure, unspecified: Secondary | ICD-10-CM | POA: Diagnosis not present

## 2022-06-09 LAB — BASIC METABOLIC PANEL
Anion gap: 13 (ref 5–15)
BUN: 97 mg/dL — ABNORMAL HIGH (ref 8–23)
CO2: 22 mmol/L (ref 22–32)
Calcium: 9.1 mg/dL (ref 8.9–10.3)
Chloride: 88 mmol/L — ABNORMAL LOW (ref 98–111)
Creatinine, Ser: 5.67 mg/dL — ABNORMAL HIGH (ref 0.61–1.24)
GFR, Estimated: 9 mL/min — ABNORMAL LOW (ref >60)
Glucose, Bld: 102 mg/dL — ABNORMAL HIGH (ref 70–99)
Potassium: 4.7 mmol/L (ref 3.5–5.1)
Sodium: 123 mmol/L — ABNORMAL LOW (ref 135–145)

## 2022-06-09 LAB — CBC
HCT: 27.4 % — ABNORMAL LOW (ref 39.0–52.0)
Hemoglobin: 9.4 g/dL — ABNORMAL LOW (ref 13.0–17.0)
MCH: 31.5 pg (ref 26.0–34.0)
MCHC: 34.3 g/dL (ref 30.0–36.0)
MCV: 91.9 fL (ref 80.0–100.0)
Platelets: 255 10*3/uL (ref 150–400)
RBC: 2.98 MIL/uL — ABNORMAL LOW (ref 4.22–5.81)
RDW: 16.5 % — ABNORMAL HIGH (ref 11.5–15.5)
WBC: 11.4 10*3/uL — ABNORMAL HIGH (ref 4.0–10.5)
nRBC: 0 % (ref 0.0–0.2)

## 2022-06-09 LAB — HEPATITIS B SURFACE ANTIGEN: Hepatitis B Surface Ag: NONREACTIVE

## 2022-06-09 LAB — HEPATITIS B CORE ANTIBODY, TOTAL: Hep B Core Total Ab: NONREACTIVE

## 2022-06-09 MED ORDER — MELATONIN 5 MG PO TABS
5.0000 mg | ORAL_TABLET | Freq: Every day | ORAL | Status: DC
  Administered 2022-06-09 – 2022-06-10 (×2): 5 mg via ORAL
  Filled 2022-06-09 (×2): qty 1

## 2022-06-09 MED ORDER — MELATONIN 1 MG PO TABS
3.0000 mg | ORAL_TABLET | Freq: Every day | ORAL | Status: DC
  Filled 2022-06-09: qty 3

## 2022-06-09 MED ORDER — NEPRO/CARBSTEADY PO LIQD
237.0000 mL | Freq: Three times a day (TID) | ORAL | Status: DC
  Administered 2022-06-09 – 2022-06-10 (×4): 237 mL via ORAL

## 2022-06-09 NOTE — Progress Notes (Signed)
[facility], [place] 06/09/22  Subjective:   Hospital day # 5  Patient seen resting in bed, daughter is at bedside Alert and oriented Appetite remains appropriate, denies nausea and vomiting Remains on room air Mild lower extremity edema  Renal: 09/16 0701 - 09/17 0700 In: 420 [P.O.:420] Out: 1025 [Urine:1025] Lab Results  Component Value Date   CREATININE 5.67 (H) 06/09/2022   CREATININE 5.51 (H) 06/08/2022   CREATININE 5.18 (H) 06/07/2022     Objective:  Vital signs in last 24 hours:  Temp:  [97.6 F (36.4 C)-97.9 F (36.6 C)] 97.8 F (36.6 C) (09/17 1218) Pulse Rate:  [39-78] 64 (09/17 1218) Resp:  [16-20] 16 (09/17 1218) BP: (114-144)/(52-60) 116/60 (09/17 1218) SpO2:  [91 %-100 %] 100 % (09/17 1218)  Weight change:  Filed Weights   06/19/2022 0928 05/29/2022 1400  Weight: 66.7 kg 66.1 kg    Intake/Output:    Intake/Output Summary (Last 24 hours) at 06/09/2022 1355 Last data filed at 06/09/2022 1000 Gross per 24 hour  Intake 210 ml  Output 1375 ml  Net -1165 ml      Physical Exam: General: NAD  HEENT Anicteric, moist oral mucous membranes  Pulm/lungs Normal breathing effort, clear to auscultation  CVS/Heart Regular, no rub  Abdomen:  Soft, nontender, nondistended  Extremities: Right greater than left 2+ pitting edema  Neurologic: Alert, oriented  Skin: No acute rashes   Foley in place       Basic Metabolic Panel:  Recent Labs  Lab 06/15/2022 0945 06/05/22 0625 06/06/22 0536 06/07/22 0505 06/08/22 0728 06/09/22 0638  NA 126* 126* 123* 124* 123* 123*  K 3.0* 4.1 4.1 4.1 4.5 4.7  CL 88* 92* 89* 89* 88* 88*  CO2 23 21* 22 22 21* 22  GLUCOSE 124* 119* 118* 98 100* 102*  BUN 79* 81* 87* 91* 94* 97*  CREATININE 4.65* 4.81* 5.01* 5.18* 5.51* 5.67*  CALCIUM 8.9 8.6* 8.6* 8.7* 8.8* 9.1  MG 2.5*  --   --   --   --   --       CBC: Recent Labs  Lab 05/24/2022 0945 06/05/22 0625 06/06/22 0536 06/07/22 0505  06/09/22 0638  WBC 7.6 5.5 7.0 6.4 11.4*  NEUTROABS 5.9  --   --   --   --   HGB 8.6* 7.8* 8.3* 8.5* 9.4*  HCT 25.1* 22.1* 23.8* 24.1* 27.4*  MCV 92.3 90.6 90.2 91.6 91.9  PLT 185 161 176 203 255      No results found for: "HEPBSAG", "HEPBSAB", "HEPBIGM"    Microbiology:  Recent Results (from the past 240 hour(s))  Urine Culture     Status: None   Collection Time: 06/09/2022 12:14 PM   Specimen: Urine, Clean Catch  Result Value Ref Range Status   Specimen Description   Final    URINE, CLEAN CATCH Performed at [facility], [address], [place], [place] [address]    Special Requests   Final    NONE Performed at [facility], [address] [place], [place] [address]    Culture   Final    NO GROWTH Performed at [facility], [place] [address], [place], [place] [address]    Report Status 06/05/2022 FINAL  Final    Coagulation Studies: No results for input(s): "LABPROT", "INR" in the last 72 hours.  Urinalysis: No results for input(s): "COLORURINE", "LABSPEC", "PHURINE", "GLUCOSEU", "HGBUR", "BILIRUBINUR", "KETONESUR", "PROTEINUR", "UROBILINOGEN", "NITRITE", "LEUKOCYTESUR" in the last 72 hours.  Invalid input(s): "APPERANCEUR"     Imaging: No results found.   Medications:     Chlorhexidine Gluconate Cloth  6 each Topical Daily   feeding supplement (NEPRO CARB STEADY)  237 mL Oral TID BM   finasteride  5 mg Oral QPM   heparin  5,000 Units Subcutaneous Q8H   polyethylene glycol  17 g Oral Daily   rosuvastatin  20 mg Oral QHS   cyanocobalamin  100 mcg Oral Daily   mouth rinse, oxyCODONE, senna-docusate  Assessment/ Plan:  88 y.o. male with hypertension, BPH, hyperlipidemia, suspected prostate cancer   admitted on 06/09/2022 for AKI (acute kidney injury) ([place]) [N17.9]  #Acute kidney injury Unclear cause but likely secondary to aggressive diuresis.  Patient also had reported short-term nonsteroidal use.  Baseline creatinine of 1.5  from April 23, 2022.  No IV contrast exposure. Foley catheter remains.  Though patient continues to have adequate urine output, renal function continues to decline.  Discussed this with patient and family and notified them that if dialysis was initiated, patient would likely require dialysis for an extended time.  Patient and family understand and are willing to proceed with dialysis.  We will consult vascular for placement of temp cath tomorrow due to elevated BUN.  We will follow-up with vascular in a couple days for placement of PermCath.  After temp cath placement tomorrow, patient will receive first dialysis treatment.  Renal navigator will seek outpatient placement.  We will still recommend palliative care consult for goals of care.   #Hyponatremia Secondary to volume overload.  Sodium 123.  Continue fluid restriction 1200 mL   #Chronic diastolic CHF, Lower extremity edema As per cardiology note, patient has grade 3 diastolic dysfunction. Diuretics remain held Edema slowly improving.     LOS: 5 Naketa Daddario 9/17/20231:55 PM  [facility] [place], [place]

## 2022-06-09 NOTE — Progress Notes (Signed)
PROGRESS NOTE    Aaron Grimes  RXV:[id number] DOB: [birth year] DOA: 06/03/2022 PCP: Lenard Simmer, MD   Brief Narrative:  Patient is a 88 year old male with history of neuropathy, hypertension, hyperlipidemia, BPH, prostate cancer who presented after his PCP referred him for the evaluation of abnormal labs.  On presentation ,he was hemodynamically stable.  Lab work showed sodium of 126, potassium of 3, creatinine of 4.65.  BNP was elevated.  Patient reported worsening shortness of breath, lower extremity swelling, decreased urine output for the last 2 weeks.  CT renal study showed bilateral perinephric stranding without hydronephrosis .urine retention was suspected and Foley was placed.  Nephrology consulted for severe worsening AKI.  Currently being monitored without dialysis  Assessment & Plan:   AKI:  -Could be secondary to aggressive diuresis?  CT renal study showed bilateral perinephric stranding without hydronephrosis  -urine retention was suspected and Foley was placed.   -Nephrology consulted-appreciate help -Renal function continues to get worse despite holding diuresis.  No indication of emergent dialysis at this time per nephrology. -We will continue to monitor his kidney function.  Avoid nephrotoxic medications. -We will consult palliative care to establish goals of care with the patient   Chronic diastolic congestive heart failure-leg edema:  -elevated BNP.  Cardio  following here.  Echo from 06/06/2022 showed EF of 50 to 55%, indeterminate left ventricular diastolic parameters -Continue to hold diuretics in the setting of worsening kidney function. -Doppler ultrasound of bilateral lower extremity resulted negative for DVT.  Continue compression stockings.  Continue strict INO's and daily weight. -Cardiology to see patient again on Monday  Bradycardia/junctional rhythm: -Heart rate noted to be in 30s and 40s. -Continue to monitor closely on telemetry.  Keep  magnesium above 2, potassium above 4.  TSH: WNL.  Hypertension: Home lisinopril on hold due to AKI. -continue prn Medications for severe hypertension.  Currently blood pressure soft   hyperlipidemia: Continue rosuvastatin   History of carotid artery disease: On aspirin at home, now on hold   Normocytic anemia: No evidence of acute blood loss.  Hemoglobin currently in the range of 7-8.  Hemoglobin was 10.4 on 10/23/2021.  Patient taking iron pill.  He reports black stools but attributes that to iron pills.  Iron studies showed low iron, given a dose of IV iron.  Negative FOBT.   Hyponatremia: This is most likely from volume overload.  Continue to monitor.  Elevated troponin: This is likely from demand ischemia.  Troponin significantly high with flat trend.  Denies any chest pain.  Cardiology following -Patient denies any chest pain.  Aspirin on hold.  Myoview Lexiscan showed no ischemia or scar, mild coronary calcification are noted.  Not a candidate for invasive procedure given AKI.   History BPH/prostate cancer: Continue finasteride.  He follows with urology.  Bone scan on 8/23 did not show any skeletal metastatic disease.  CT abdomen/pelvis done on 04/23/2022 showed small to borderline enlarged abdominal/pelvic retroperitoneal lymph nodes indicative of metastatic disease.   -Consulted urology-recommended outpatient follow-up   History of bilateral shoulder arthritis: -On oxycodone as needed.  Debility/deconditioning: PT/OT consulted recommended home health PT/OT.  Patient is increased risk for morbidity and mortality due to worsening kidney function and other multiple medical problems.  consulted palliative care to discuss goals of care with patient.  DVT prophylaxis: Heparin Code Status: DNR Family Communication: None present at bedside.  Plan of care discussed with patient in length and he verbalized understanding and agreed with it. Disposition Plan:  To be determined  Consultants:   Nephrology Cardiology Palliative care  Procedures:  None  Antimicrobials:  None  Status is: Inpatient    Subjective: Patient seen and examined.  Sitting comfortably on the bed.  He is concerned about his kidney function.  Reports that he slept well last night after taking oxycodone for shoulder pain.  No new complaints.  No acute events overnight.  Objective: Vitals:   06/08/22 2100 06/09/22 0029 06/09/22 0500 06/09/22 0924  BP: (!) 114/52 (!) 127/54 (!) 144/60 (!) 128/59  Pulse: (!) 44 (!) 41 (!) 44 78  Resp: '18 20 20 18  '$ Temp: 97.8 F (36.6 C) 97.9 F (36.6 C) 97.7 F (36.5 C) 97.6 F (36.4 C)  TempSrc: Oral Oral Oral Oral  SpO2: 100% 96% 94% 91%  Weight:      Height:        Intake/Output Summary (Last 24 hours) at 06/09/2022 1046 Last data filed at 06/09/2022 1000 Gross per 24 hour  Intake 210 ml  Output 1375 ml  Net -1165 ml    Filed Weights   06/08/2022 0928 05/31/2022 1400  Weight: 66.7 kg 66.1 kg    Examination:  General exam: Appears calm and comfortable on room air, communicating well, sitting comfortably on bed  respiratory system: Clear to auscultation. Respiratory effort normal. Cardiovascular system: S1 & S2 heard, RRR. No JVD, murmurs, rubs, gallops or clicks.  biLateral 2+ pitting edema positive.  Has compression stockings gastrointestinal system: Abdomen is nondistended, soft and nontender. No organomegaly or masses felt. Normal bowel sounds heard. Central nervous system: Alert and oriented. No focal neurological deficits. Extremities: Symmetric 5 x 5 power. Skin: No rashes, lesions or ulcers Psychiatry: Judgement and insight appear normal. Mood & affect appropriate.    Data Reviewed: I have personally reviewed following labs and imaging studies  CBC: Recent Labs  Lab 06/21/2022 0945 06/05/22 0625 06/06/22 0536 06/07/22 0505 06/09/22 0638  WBC 7.6 5.5 7.0 6.4 11.4*  NEUTROABS 5.9  --   --   --   --   HGB 8.6* 7.8* 8.3* 8.5* 9.4*  HCT  25.1* 22.1* 23.8* 24.1* 27.4*  MCV 92.3 90.6 90.2 91.6 91.9  PLT 185 161 176 203 633    Basic Metabolic Panel: Recent Labs  Lab 06/12/2022 0945 06/05/22 0625 06/06/22 0536 06/07/22 0505 06/08/22 0728 06/09/22 0638  NA 126* 126* 123* 124* 123* 123*  K 3.0* 4.1 4.1 4.1 4.5 4.7  CL 88* 92* 89* 89* 88* 88*  CO2 23 21* 22 22 21* 22  GLUCOSE 124* 119* 118* 98 100* 102*  BUN 79* 81* 87* 91* 94* 97*  CREATININE 4.65* 4.81* 5.01* 5.18* 5.51* 5.67*  CALCIUM 8.9 8.6* 8.6* 8.7* 8.8* 9.1  MG 2.5*  --   --   --   --   --     GFR: Estimated Creatinine Clearance: 8.3 mL/min (A) (by C-G formula based on SCr of 5.67 mg/dL (H)). Liver Function Tests: Recent Labs  Lab 06/02/2022 0945  AST 80*  ALT 43  ALKPHOS 96  BILITOT 0.8  PROT 5.9*  ALBUMIN 3.0*    No results for input(s): "LIPASE", "AMYLASE" in the last 168 hours. No results for input(s): "AMMONIA" in the last 168 hours. Coagulation Profile: No results for input(s): "INR", "PROTIME" in the last 168 hours. Cardiac Enzymes: No results for input(s): "CKTOTAL", "CKMB", "CKMBINDEX", "TROPONINI" in the last 168 hours. BNP (last 3 results) No results for input(s): "PROBNP" in the last 8760 hours. HbA1C: No  results for input(s): "HGBA1C" in the last 72 hours. CBG: No results for input(s): "GLUCAP" in the last 168 hours. Lipid Profile: No results for input(s): "CHOL", "HDL", "LDLCALC", "TRIG", "CHOLHDL", "LDLDIRECT" in the last 72 hours. Thyroid Function Tests: No results for input(s): "TSH", "T4TOTAL", "FREET4", "T3FREE", "THYROIDAB" in the last 72 hours. Anemia Panel: No results for input(s): "VITAMINB12", "FOLATE", "FERRITIN", "TIBC", "IRON", "RETICCTPCT" in the last 72 hours. Sepsis Labs: No results for input(s): "PROCALCITON", "LATICACIDVEN" in the last 168 hours.  Recent Results (from the past 240 hour(s))  Urine Culture     Status: None   Collection Time: 06/17/2022 12:14 PM   Specimen: Urine, Clean Catch  Result Value Ref  Range Status   Specimen Description   Final    URINE, CLEAN CATCH Performed at [facility], [address], [place], [place] [address]    Special Requests   Final    NONE Performed at [facility], [address], [place], [place] [address]    Culture   Final    NO GROWTH Performed at [facility], [place] [address], [place], [place] [address]    Report Status 06/05/2022 FINAL  Final      Radiology Studies: No results found.  Scheduled Meds:  Chlorhexidine Gluconate Cloth  6 each Topical Daily   finasteride  5 mg Oral QPM   heparin  5,000 Units Subcutaneous Q8H   polyethylene glycol  17 g Oral Daily   rosuvastatin  20 mg Oral QHS   cyanocobalamin  100 mcg Oral Daily   Continuous Infusions:   LOS: 5 days   Time spent: 35 minutes   Hadriel Northup Loann Quill, MD [facility]  If 7PM-7AM, please contact night-coverage [email or web address] 06/09/2022, 10:46 AM

## 2022-06-10 ENCOUNTER — Encounter: Admission: EM | Disposition: E | Payer: Self-pay | Source: Ambulatory Visit | Attending: Internal Medicine

## 2022-06-10 ENCOUNTER — Encounter: Payer: Self-pay | Admitting: Vascular Surgery

## 2022-06-10 DIAGNOSIS — I5031 Acute diastolic (congestive) heart failure: Secondary | ICD-10-CM | POA: Diagnosis not present

## 2022-06-10 DIAGNOSIS — N179 Acute kidney failure, unspecified: Secondary | ICD-10-CM | POA: Diagnosis not present

## 2022-06-10 DIAGNOSIS — Z515 Encounter for palliative care: Secondary | ICD-10-CM

## 2022-06-10 DIAGNOSIS — N186 End stage renal disease: Secondary | ICD-10-CM | POA: Diagnosis not present

## 2022-06-10 DIAGNOSIS — R799 Abnormal finding of blood chemistry, unspecified: Secondary | ICD-10-CM | POA: Diagnosis not present

## 2022-06-10 DIAGNOSIS — N189 Chronic kidney disease, unspecified: Secondary | ICD-10-CM | POA: Diagnosis not present

## 2022-06-10 DIAGNOSIS — I5033 Acute on chronic diastolic (congestive) heart failure: Secondary | ICD-10-CM | POA: Diagnosis not present

## 2022-06-10 DIAGNOSIS — Z66 Do not resuscitate: Secondary | ICD-10-CM

## 2022-06-10 DIAGNOSIS — I10 Essential (primary) hypertension: Secondary | ICD-10-CM | POA: Diagnosis not present

## 2022-06-10 DIAGNOSIS — E785 Hyperlipidemia, unspecified: Secondary | ICD-10-CM | POA: Diagnosis present

## 2022-06-10 DIAGNOSIS — N401 Enlarged prostate with lower urinary tract symptoms: Secondary | ICD-10-CM | POA: Diagnosis not present

## 2022-06-10 HISTORY — PX: TEMPORARY DIALYSIS CATHETER: CATH118312

## 2022-06-10 LAB — BASIC METABOLIC PANEL
Anion gap: 15 (ref 5–15)
BUN: 101 mg/dL — ABNORMAL HIGH (ref 8–23)
CO2: 21 mmol/L — ABNORMAL LOW (ref 22–32)
Calcium: 8.9 mg/dL (ref 8.9–10.3)
Chloride: 89 mmol/L — ABNORMAL LOW (ref 98–111)
Creatinine, Ser: 6.08 mg/dL — ABNORMAL HIGH (ref 0.61–1.24)
GFR, Estimated: 8 mL/min — ABNORMAL LOW (ref >60)
Glucose, Bld: 112 mg/dL — ABNORMAL HIGH (ref 70–99)
Potassium: 4.6 mmol/L (ref 3.5–5.1)
Sodium: 125 mmol/L — ABNORMAL LOW (ref 135–145)

## 2022-06-10 LAB — CBC
HCT: 24.5 % — ABNORMAL LOW (ref 39.0–52.0)
Hemoglobin: 8.6 g/dL — ABNORMAL LOW (ref 13.0–17.0)
MCH: 32.2 pg (ref 26.0–34.0)
MCHC: 35.1 g/dL (ref 30.0–36.0)
MCV: 91.8 fL (ref 80.0–100.0)
Platelets: 230 10*3/uL (ref 150–400)
RBC: 2.67 MIL/uL — ABNORMAL LOW (ref 4.22–5.81)
RDW: 16.8 % — ABNORMAL HIGH (ref 11.5–15.5)
WBC: 7.3 10*3/uL (ref 4.0–10.5)
nRBC: 0 % (ref 0.0–0.2)

## 2022-06-10 LAB — MAGNESIUM: Magnesium: 2.5 mg/dL — ABNORMAL HIGH (ref 1.7–2.4)

## 2022-06-10 SURGERY — TEMPORARY DIALYSIS CATHETER
Anesthesia: Moderate Sedation

## 2022-06-10 MED ORDER — PENTAFLUOROPROP-TETRAFLUOROETH EX AERO: 1.0000 | INHALATION_SPRAY | CUTANEOUS | Status: DC | PRN

## 2022-06-10 MED ORDER — LIDOCAINE HCL (PF) 1 % IJ SOLN: 5.0000 mL | INTRAMUSCULAR | Status: DC | PRN

## 2022-06-10 MED ORDER — LIDOCAINE-PRILOCAINE 2.5-2.5 % EX CREA: 1.0000 | TOPICAL_CREAM | CUTANEOUS | Status: DC | PRN

## 2022-06-10 MED ORDER — SODIUM CHLORIDE 0.9 % IV SOLN: INTRAVENOUS | Status: DC

## 2022-06-10 MED ORDER — ANTICOAGULANT SODIUM CITRATE 4% (200MG/5ML) IV SOLN: 5.0000 mL | Status: DC | PRN

## 2022-06-10 MED ORDER — SODIUM CHLORIDE 0.9 % IV BOLUS: 250.0000 mL | Freq: Once | INTRAVENOUS | Status: DC

## 2022-06-10 MED ORDER — ALTEPLASE 2 MG IJ SOLR: 2.0000 mg | Freq: Once | INTRAMUSCULAR | Status: DC | PRN

## 2022-06-10 MED ORDER — SODIUM CHLORIDE 0.9 % IV BOLUS
250.0000 mL | Freq: Once | INTRAVENOUS | Status: AC
  Administered 2022-06-10: 250 mL via INTRAVENOUS

## 2022-06-10 MED ORDER — LIDOCAINE HCL (PF) 1 % IJ SOLN
INTRAMUSCULAR | Status: DC | PRN
  Administered 2022-06-10: 5 mL via INTRADERMAL

## 2022-06-10 MED ORDER — HEPARIN SODIUM (PORCINE) 1000 UNIT/ML DIALYSIS: 1000.0000 [IU] | INTRAMUSCULAR | Status: DC | PRN

## 2022-06-10 SURGICAL SUPPLY — 3 items
COVER PROBE ULTRASOUND 5X96 (MISCELLANEOUS) IMPLANT
KIT DIALYSIS CATH TRI 30X13 (CATHETERS) IMPLANT
PACK ANGIOGRAPHY (CUSTOM PROCEDURE TRAY) ×1 IMPLANT

## 2022-06-10 NOTE — Progress Notes (Signed)
Hd tx of 2hrs completed. 24L total vol processed. No complications. Report given to [employer], rn. Total uf removed: 0L Post hd wt: 70.4kgs Post hd v/s: 97.7 122/46(68) 40 18 97%

## 2022-06-10 NOTE — Progress Notes (Signed)
Rounding Note    Patient Name: Aaron Grimes Date of Encounter: 06/15/2022  [place] Cardiologist: None   Subjective   Patient seen on rounds. Denies any chest pain or shortness of breath. Continuing worsening serum creatinine at 6.08. Dialysis catheter placement done today. Patient continues to make urine. -84.8 mls output in the last 24 hours.    Inpatient Medications    Scheduled Meds:  Chlorhexidine Gluconate Cloth  6 each Topical Daily   feeding supplement (NEPRO CARB STEADY)  237 mL Oral TID BM   finasteride  5 mg Oral QPM   heparin  5,000 Units Subcutaneous Q8H   melatonin  5 mg Oral QHS   polyethylene glycol  17 g Oral Daily   rosuvastatin  20 mg Oral QHS   cyanocobalamin  100 mcg Oral Daily   Continuous Infusions:  anticoagulant sodium citrate     sodium chloride Stopped (05/27/2022 1210)   PRN Meds: alteplase, anticoagulant sodium citrate, heparin, lidocaine (PF), lidocaine-prilocaine, mouth rinse, oxyCODONE, pentafluoroprop-tetrafluoroeth, senna-docusate   Vital Signs    Vitals:   06/15/2022 1042 06/14/2022 1128 06/09/2022 1147 05/28/2022 1208  BP: (!) 109/46 (!) 96/40 (!) 106/51 (!) 118/56  Pulse: (!) 44 (!) 34 (!) 47 (!) 45  Resp:    16  Temp: (!) 97.5 F (36.4 C)   97.7 F (36.5 C)  TempSrc: Oral   Oral  SpO2: 97% 92% 96% 97%  Weight:      Height:        Intake/Output Summary (Last 24 hours) at 05/24/2022 1245 Last data filed at 06/19/2022 1027 Gross per 24 hour  Intake 855.25 ml  Output 350 ml  Net 505.25 ml      06/02/2022    2:00 PM 06/07/2022    9:28 AM 04/09/2022   11:19 AM  Last 3 Weights  Weight (lbs) 145 lb 12.8 oz 147 lb 0.8 oz 147 lb  Weight (kg) 66.134 kg 66.7 kg 66.679 kg      Telemetry    Sinus brady and junctional 30-40's - Personally Reviewed  ECG    No new tracings - Personally Reviewed  Physical Exam   GEN: No acute distress.   Neck: No JVD Cardiac: RRR, bradycardic, no murmurs, rubs, or gallops.   Respiratory: Clear to auscultation bilaterally. Respirations are unlabored at rest on room air GI: Soft, nontender, non-distended  MS: No edema; No deformity. Neuro:  Nonfocal  Psych: Normal affect   Labs    High Sensitivity Troponin:   Recent Labs  Lab 06/05/2022 0945 06/21/2022 1147  TROPONINIHS 245* 239*     Chemistry Recent Labs  Lab 06/20/2022 0945 06/05/22 0625 06/08/22 0728 06/09/22 0638 05/27/2022 0711  NA 126*   < > 123* 123* 125*  K 3.0*   < > 4.5 4.7 4.6  CL 88*   < > 88* 88* 89*  CO2 23   < > 21* 22 21*  GLUCOSE 124*   < > 100* 102* 112*  BUN 79*   < > 94* 97* 101*  CREATININE 4.65*   < > 5.51* 5.67* 6.08*  CALCIUM 8.9   < > 8.8* 9.1 8.9  MG 2.5*  --   --   --  2.5*  PROT 5.9*  --   --   --   --   ALBUMIN 3.0*  --   --   --   --   AST 80*  --   --   --   --  ALT 43  --   --   --   --   ALKPHOS 96  --   --   --   --   BILITOT 0.8  --   --   --   --   GFRNONAA 11*   < > 9* 9* 8*  ANIONGAP 15   < > '14 13 15   '$ < > = values in this interval not displayed.    Lipids No results for input(s): "CHOL", "TRIG", "HDL", "LABVLDL", "LDLCALC", "CHOLHDL" in the last 168 hours.  Hematology Recent Labs  Lab 06/07/22 0505 06/09/22 0638 06/20/2022 0711  WBC 6.4 11.4* 7.3  RBC 2.63* 2.98* 2.67*  HGB 8.5* 9.4* 8.6*  HCT 24.1* 27.4* 24.5*  MCV 91.6 91.9 91.8  MCH 32.3 31.5 32.2  MCHC 35.3 34.3 35.1  RDW 16.3* 16.5* 16.8*  PLT 203 255 230   Thyroid  Recent Labs  Lab 06/07/2022 0945  TSH 2.501    BNP Recent Labs  Lab 06/21/2022 0945  BNP 2,819.7*    DDimer No results for input(s): "DDIMER" in the last 168 hours.   Radiology    PERIPHERAL VASCULAR CATHETERIZATION  Result Date: 06/20/2022 See surgical note for result.   Cardiac Studies  TTE 06/06/22  1. Left ventricular ejection fraction, by estimation, is 50 to 55%. The  left ventricle has low normal function. The left ventricle has no regional  wall motion abnormalities. There is moderate left ventricular  hypertrophy.  Left ventricular diastolic  parameters are indeterminate.   2. Right ventricular systolic function is mildly reduced. The right  ventricular size is moderately enlarged. There is normal pulmonary artery  systolic pressure. The estimated right ventricular systolic pressure is  85.4 mmHg.   3. Left atrial size was mildly dilated.   4. Moderate pleural effusion in the left lateral region.   5. The mitral valve is normal in structure. Moderate mitral valve  regurgitation. No evidence of mitral stenosis.   6. Tricuspid valve regurgitation is moderate.   7. The aortic valve is tricuspid. Aortic valve regurgitation is not  visualized. Aortic valve sclerosis/calcification is present, without any  evidence of aortic stenosis. Aortic valve mean gradient measures 3.0 mmHg.   8. The inferior vena cava is normal in size with greater than 50%  respiratory variability, suggesting right atrial pressure of 3 mmHg.   9. Bradycardia, rate 44 bpm  10. Right atrial size was mildly dilated.   Patient Profile     88 y.o. male with a history of hypertension, hyperlipidemia, BPH, prostate cancer, and neuropathy, who is being seen and evaluated for peripheral edema and acute renal failure.   Assessment & Plan    HFpEF/leg edema -LVEF 60-65%, G3DD, mild MR, mild pulmonary hypertension, mildly dilated RA -diuretics held with worsening kidney function -daily weight, I&O, low sodium diet  Elevated Hs troponins -denies chest pain -elevated suspected from demand ischemia -Myoview lexiscan showed no ischemia or scar, mild coronary calcifications -not a candidate for invasive procedure given anemia and worsening kidney function -asa held for anemia -continue statin therapy  Bradycardia/junctional rhythm -remains in sinus to junctional on telemetry monitor rtes in 30's and 40's -remains asymptomatic -TSH WNL -not on any rate limiting medications -continue telemetry monitoring  Acute kidney  injury/h/o BPH metastatic prostate cancer/hyponatremia -serum creatinine 6.08 -baseline serum creatinine 1.5 from 04/2022 -HD catheter placed today by vascular -followed by nephrology and urology -obstructive uropathy likely secondary to BPH and prostate cancer -continues to have indwelling foley -  awaiting palliative consult prior to initiation of Hemodialysis given multiple underlying comorbidities and underlying malignancy  Iron deficiency anemia -asa on hold -no active signs of bleeding -FOBT negative -hgb 8.6  6.    Hypertension -blood pressure 106/51 -PTA lisinopril on hold due to AKI -blood pressures have been soft -vital signs per unit protocol      For questions or updates, please contact [place] Please consult [email or web address] for contact info under        Signed, Simmone Cape, NP  06/20/2022, 12:45 PM

## 2022-06-10 NOTE — Assessment & Plan Note (Signed)
Continue with statin therapy.  ?

## 2022-06-10 NOTE — Progress Notes (Signed)
[place], [place] 06/02/2022  Subjective:   Hospital day # 6  Patient seen sitting at side of bed. Daughter at bedside  Feels well today, requesting to ambulate in hall.   Renal: 09/17 0701 - 09/18 0700 In: 615.3 [P.O.:120; IV Piggyback:495.3] Out: 700 [Urine:700] Lab Results  Component Value Date   CREATININE 6.08 (H) 05/27/2022   CREATININE 5.67 (H) 06/09/2022   CREATININE 5.51 (H) 06/08/2022     Objective:  Vital signs in last 24 hours:  Temp:  [97.5 F (36.4 C)-98.2 F (36.8 C)] 97.7 F (36.5 C) (09/18 1208) Pulse Rate:  [34-47] 45 (09/18 1208) Resp:  [16-18] 16 (09/18 1208) BP: (90-118)/(40-56) 118/56 (09/18 1208) SpO2:  [92 %-99 %] 97 % (09/18 1208)  Weight change:  Filed Weights   06/03/2022 0928 05/27/2022 1400  Weight: 66.7 kg 66.1 kg    Intake/Output:    Intake/Output Summary (Last 24 hours) at 06/13/2022 1426 Last data filed at 06/13/2022 1027 Gross per 24 hour  Intake 855.25 ml  Output 200 ml  Net 655.25 ml      Physical Exam: General: NAD  HEENT Anicteric, moist oral mucous membranes  Pulm/lungs Normal breathing effort, clear to auscultation  CVS/Heart Regular, no rub  Abdomen:  Soft, nontender, nondistended  Extremities: Right greater than left 2+ pitting edema  Neurologic: Alert, oriented  Skin: No acute rashes   Foley in place       Basic Metabolic Panel:  Recent Labs  Lab 06/12/2022 0945 06/05/22 0625 06/06/22 0536 06/07/22 0505 06/08/22 0728 06/09/22 0638 06/21/2022 0711  NA 126*   < > 123* 124* 123* 123* 125*  K 3.0*   < > 4.1 4.1 4.5 4.7 4.6  CL 88*   < > 89* 89* 88* 88* 89*  CO2 23   < > 22 22 21* 22 21*  GLUCOSE 124*   < > 118* 98 100* 102* 112*  BUN 79*   < > 87* 91* 94* 97* 101*  CREATININE 4.65*   < > 5.01* 5.18* 5.51* 5.67* 6.08*  CALCIUM 8.9   < > 8.6* 8.7* 8.8* 9.1 8.9  MG 2.5*  --   --   --   --   --  2.5*   < > = values in this interval not displayed.      CBC: Recent Labs  Lab  06/18/2022 0945 06/05/22 0625 06/06/22 0536 06/07/22 0505 06/09/22 0638 05/24/2022 0711  WBC 7.6 5.5 7.0 6.4 11.4* 7.3  NEUTROABS 5.9  --   --   --   --   --   HGB 8.6* 7.8* 8.3* 8.5* 9.4* 8.6*  HCT 25.1* 22.1* 23.8* 24.1* 27.4* 24.5*  MCV 92.3 90.6 90.2 91.6 91.9 91.8  PLT 185 161 176 203 255 230       Lab Results  Component Value Date   HEPBSAG NON REACTIVE 06/09/2022      Microbiology:  Recent Results (from the past 240 hour(s))  Urine Culture     Status: None   Collection Time: 05/26/2022 12:14 PM   Specimen: Urine, Clean Catch  Result Value Ref Range Status   Specimen Description   Final    URINE, CLEAN CATCH Performed at [facility], [address] [place], [place] [address]    Special Requests   Final    NONE Performed at [facility], [address] [place], [place] [address]    Culture   Final    NO GROWTH Performed  at [facility], [place] [address] [place], [place] [address]    Report Status 06/05/2022 FINAL  Final    Coagulation Studies: No results for input(s): "LABPROT", "INR" in the last 72 hours.  Urinalysis: No results for input(s): "COLORURINE", "LABSPEC", "PHURINE", "GLUCOSEU", "HGBUR", "BILIRUBINUR", "KETONESUR", "PROTEINUR", "UROBILINOGEN", "NITRITE", "LEUKOCYTESUR" in the last 72 hours.  Invalid input(s): "APPERANCEUR"     Imaging: PERIPHERAL VASCULAR CATHETERIZATION  Result Date: 05/24/2022 See surgical note for result.    Medications:    anticoagulant sodium citrate     sodium chloride Stopped (05/29/2022 1210)    Chlorhexidine Gluconate Cloth  6 each Topical Daily   feeding supplement (NEPRO CARB STEADY)  237 mL Oral TID BM   finasteride  5 mg Oral QPM   heparin  5,000 Units Subcutaneous Q8H   melatonin  5 mg Oral QHS   polyethylene glycol  17 g Oral Daily   rosuvastatin  20 mg Oral QHS   cyanocobalamin  100 mcg Oral Daily   alteplase, anticoagulant sodium citrate, heparin, lidocaine (PF),  lidocaine-prilocaine, mouth rinse, oxyCODONE, pentafluoroprop-tetrafluoroeth, senna-docusate  Assessment/ Plan:  88 y.o. male with hypertension, BPH, hyperlipidemia, suspected prostate cancer   admitted on 06/18/2022 for AKI (acute kidney injury) ([place]) [N17.9]  #Acute kidney injury Unclear cause but likely secondary to aggressive diuresis.  Patient also had reported short-term nonsteroidal use.  Baseline creatinine of 1.5 from April 23, 2022.  No IV contrast exposure.  Renal function continues to decline. UOP 757m in past 24 hours. Patient is agreeable to proceed with dialysis. Due to elevated BUN, will request a temp cath and have permcath placed later in the week. Will initiate dialysis later today.Next treatment scheduled for  Renal navigator aware of patient and will begin seeking outpatient placement.    #Hyponatremia Secondary to volume overload.  Sodium improving 125.  Continue fluid restriction 1200 mL   #Chronic diastolic CHF, Lower extremity edema As per cardiology note, patient has grade 3 diastolic dysfunction.   LOS: 6[place]9/18/20232:26 PM  [place]

## 2022-06-10 NOTE — Progress Notes (Signed)
Progress Note   Patient: Aaron Grimes PNT:[id number] DOB: 08-17-1930 DOA: 05/27/2022     6 DOS: the patient was seen and examined on 05/28/2022   Brief hospital course: Mr. [name] is an 88 year old male with neuropathy, hypertension, hyperlipidemia, BPH, presents emergency department for chief concerns of abnormal labs via PCP.  Initial vitals in the emergency department showed temperature of 97.5, respiration rate of 13, heart rate of 40, blood pressure 112/49, SPO2 of 98% on room air.  Serum sodium is 126, potassium 3.0, chloride of 88, bicarb 23, BUN of 79, serum creatinine of 4.65, e GFR was 11, nonfasting blood glucose is 124.  WBC 7.6, hemoglobin 8.6, platelets of 185.  BNP was elevated at 2819.7.  High sensitive troponin was 245.  EDP discussed the patient with Dr. Candiss Norse who recommends Foley placement and correct potassium and admission with renal monitoring.  CT renal stone Grimes: Bilateral perinephritic stranding without hydronephrosis.  Findings could reflect renal dysfunction or slight increase in generalized findings of mild anasarca.  Small bilateral pleural effusion with by basilar atelectasis.  Query benign finding process such as fibrous dysplasia versus monostotic metastatic process in the right posterior eighth rib.  Small retroperitoneal lymph nodes and pelvic lymph nodes.  Portable chest x-ray was read as no evidence of acute cardiopulmonary disease.  ED treatment: Potassium chloride 20 mill equivalent p.o. one-time dose, calcium gluconate 1 g IV, potassium chloride 10 mill equivalent IV x2 doses.  Patient had worsening renal function.   09/18 temporal HD cathter placed for renal replacement therapy.     Assessment and Plan: * Acute kidney injury superimposed on chronic kidney disease ([place]) CKD stage 3b.  Hyponatremia, hyperkalemia   Patient with worsening renal function with serum cr at 6.0 with BUN 101, k 4,6 and serum bicarbonate at 21.    Patient had his temporal HD cathter placed today, right femoral vein.  Plan to start patient on renal replacement therapy.   Anemia of chronic renal disease.  hgb is 8,6   BPH with obstruction/lower urinary tract symptoms - Finasteride 5 mg daily resumed for every evening Patient has a foley cathter in place.   Prostate cancer. CT abdomen and pelvis with retroperitoneal nodes, indicative of metastatic disease.  Follow up with urology as outpatient.   Essential hypertension Hypotension.   Patient had hypotension 96/40 post procedure.  Added one time bolus 250 ml of NS Continue close blood pressure monitoring.   Heart failure with preserved ejection fraction (HCC) Echocardiogram with preserved LV systolic function EF 50 to 55%, with moderate LVH. RV with mild reduction in systolic function. Moderate enlarge cavity. Moderate TR and MR.   Elevated troponin with no signs of acute coronary syndrome.   Continue edematous.  Plan to start patient with renal replacement therapy.  Ultrafiltration as tolerated.   Bradycardia with junctional rhythm Avoid B blocker.   Dyslipidemia Continue with statin therapy.   Iron deficiency anemia Continue with iron supplementation.   Hypokalemia - Presumed secondary to AKI - Status post replacement slowly with potassium chloride 20 mill equivalents p.o., potassium chloride 10 mill equivalent x2 by IV - Repeat BMP in the a.m.        Subjective: Patient with no chest pain, dyspnea has been stable.   Physical Exam: Vitals:   06/18/2022 1042 05/26/2022 1128 06/03/2022 1147 05/24/2022 1208  BP: (!) 109/46 (!) 96/40 (!) 106/51 (!) 118/56  Pulse: (!) 44 (!) 34 (!) 47 (!) 45  Resp:    16  Temp: (!) 97.5 F (36.4 C)   97.7 F (36.5 C)  TempSrc: Oral   Oral  SpO2: 97% 92% 96% 97%  Weight:      Height:       Neurology awake and alert ENT with pallor Cardiovascular with S1 and S2 present with no gallops or rubs Respiratory with rales at  bases with no wheezing Abdomen with no distention  Positive lower extremity edema ++ pitting bilaterally  Data Reviewed:    Family Communication: I spoke with patient's daughter at the bedside, we talked in detail about patient's condition, plan of care and prognosis and all questions were addressed.   Disposition: Status is: Inpatient Remains inpatient appropriate because: renal replacement therapy inpatient   Planned Discharge Destination: Home     Author: Tawni Millers, MD 06/12/2022 12:42 PM  For on call review [email or web address].

## 2022-06-10 NOTE — Op Note (Signed)
  OPERATIVE NOTE   PROCEDURE: Ultrasound guidance for vascular access right femoral vein Placement of a 30 cm triple lumen dialysis catheter right femoral vein  PRE-OPERATIVE DIAGNOSIS: 1. Acute renal failure 2. BUN >100  POST-OPERATIVE DIAGNOSIS: Same  SURGEON: Leotis Pain, MD  ASSISTANT(S): None  ANESTHESIA: local  ESTIMATED BLOOD LOSS: Minimal   FINDING(S): 1.  None  SPECIMEN(S):  None  INDICATIONS:    Patient is a 88 y.o.male who presents with renal failure and despite appropriate medical therapy his renal functions continue to decline he now has a BUN of over 100 and needs to initiate dialysis at this time and we have been asked to place a temporary dialysis catheter.  Risks and benefits were discussed, and informed consent was obtained..  DESCRIPTION: After obtaining full informed written consent, the patient was laid flat in the bed.  The right groin was sterilely prepped and draped in a sterile surgical field was created. The right femoral vein was visualized with ultrasound and found to be widely patent. It was then accessed under direct guidance without difficulty with a Seldinger needle and a permanent image was recorded. A J-wire was then placed. After skin nick and dilatation, a 30 triple lumen dialysis catheter was placed over the wire and the wire was removed. The lumens withdrew dark red nonpulsatile blood and flushed easily with sterile saline. The catheter was secured to the skin with 3 nylon sutures. Sterile dressing was placed.  COMPLICATIONS: None  CONDITION: Stable  Leotis Pain 06/14/2022 11:26 AM  This note was created with Dragon Medical transcription system. Any errors in dictation are purely unintentional.

## 2022-06-10 NOTE — Progress Notes (Signed)
Pre HD RN assessment 

## 2022-06-10 NOTE — Progress Notes (Signed)
Mobility Specialist - Progress Note   06/16/2022 1016  Mobility  Activity Ambulated with assistance in hallway;Stood at bedside;Dangled on edge of bed  Level of Assistance Standby assist, set-up cues, supervision of patient - no hands on  Assistive Device Front wheel walker  Distance Ambulated (ft) 160 ft  Activity Response Tolerated well  $Mobility charge 1 Mobility   Pt EOB on RA upon arrival. PT STS with supervision. PT ambulates 1 lap around NS with slow gait and 4 rest breaks. Pt motivated to complete lap. Pt returns to bed with needs in reach, family in room, and NT in room.   Gretchen Short  Mobility Specialist  05/30/2022 10:18 AM

## 2022-06-10 NOTE — Progress Notes (Signed)
Physical Therapy Treatment Patient Details Name: Aaron Grimes MRN: [id number] DOB: 1930/02/03 Today's Date: 06/09/2022   History of Present Illness Patient is a 88 year old male with history of neuropathy, hypertension, hyperlipidemia, BPH, diastolic CHF,  prostate cancer who presented after his PCP referred him for the evaluation of abnormal labs.    PT Comments    Patient alert, agreeable to some mobility, and educated on temp HD mobility restrictions, verbalized understanding. Supine to sit with supervision, pt still with complaints of bilateral shoulders. Sit <> stand with RW and supervision, and did some standing/stretching. He did take a few steps towards [facility] but session also limited by arrival of transport. Returned to  supine with family and RN at bedside. The patient would benefit from further skilled PT intervention to continue to progress towards goals. Recommendation remains appropriate.      Recommendations for follow up therapy are one component of a multi-disciplinary discharge planning process, led by the attending physician.  Recommendations may be updated based on patient status, additional functional criteria and insurance authorization.  Follow Up Recommendations  Home health PT     Assistance Recommended at Discharge Intermittent Supervision/Assistance  Patient can return home with the following Assist for transportation;Help with stairs or ramp for entrance;Assistance with cooking/housework   Equipment Recommendations  Rolling walker (2 wheels)    Recommendations for Other Services       Precautions / Restrictions Precautions Precautions: Fall Restrictions Weight Bearing Restrictions: No     Mobility  Bed Mobility Overal bed mobility: Needs Assistance Bed Mobility: Supine to Sit, Sit to Supine     Supine to sit: Supervision Sit to supine: Supervision        Transfers Overall transfer level: Needs assistance Equipment used: Rolling  walker (2 wheels) Transfers: Sit to/from Stand Sit to Stand: Supervision                Ambulation/Gait               General Gait Details: true ambulation deferred due to temp cath. did take a few steps towards [place]             Wheelchair Mobility    Modified Rankin (Stroke Patients Only)       Balance Overall balance assessment: Needs assistance Sitting-balance support: Feet supported Sitting balance-Leahy Scale: Good       Standing balance-Leahy Scale: Fair                              Cognition Arousal/Alertness: Awake/alert Behavior During Therapy: WFL for tasks assessed/performed Overall Cognitive Status: Within Functional Limits for tasks assessed                                          Exercises      General Comments        Pertinent Vitals/Pain Pain Assessment Pain Assessment: Faces Pain Location: Shoulder Pain Descriptors / Indicators: Aching, Grimacing, Moaning Pain Intervention(s): Limited activity within patient's tolerance, Monitored during session, Repositioned    Home Living                          Prior Function            PT Goals (current goals can now be found in the care  plan section) Progress towards PT goals: Progressing toward goals    Frequency    Min 2X/week      PT Plan Current plan remains appropriate    Co-evaluation              AM-PAC PT "6 Clicks" Mobility   Outcome Measure  Help needed turning from your back to your side while in a flat bed without using bedrails?: None Help needed moving from lying on your back to sitting on the side of a flat bed without using bedrails?: None Help needed moving to and from a bed to a chair (including a wheelchair)?: None Help needed standing up from a chair using your arms (e.g., wheelchair or bedside chair)?: None Help needed to walk in hospital room?: A Little Help needed climbing 3-5 steps with  a railing? : A Little 6 Click Score: 22    End of Session   Activity Tolerance: Patient tolerated treatment well Patient left: in bed;Other (comment);with nursing/sitter in room (with transport) Nurse Communication: Mobility status PT Visit Diagnosis: Other abnormalities of gait and mobility (R26.89);Difficulty in walking, not elsewhere classified (R26.2);Muscle weakness (generalized) (M62.81)     Time: 1660-6301 PT Time Calculation (min) (ACUTE ONLY): 14 min  Charges:  $Therapeutic Activity: 8-22 mins                     Lieutenant Diego PT, DPT 3:55 PM,06/12/2022

## 2022-06-10 NOTE — Consult Note (Signed)
[facility] Vascular Consult Note  MRN : [id number]  Aaron Grimes is a 88 y.o. (05/24/1930) male who presents with chief complaint of  Chief Complaint  Patient presents with   Abnormal Labs  .  History of Present Illness: Patient is admitted to the hospital with poor urine output and decreased urine production and has developed acute renal failure.  The patient reports lethargy and fatigue with poor energy and not a great appetite.  The patient is critically ill with a BUN now over 100 despite appropriate medical therapy over his 5 to 6 days in the hospital.  He has anemia likely of chronic disease. The nephrology service has decided to initiate dialysis at this time, and we are asked to place a temporary dialysis catheter for immediate dialysis use.    Current Facility-Administered Medications  Medication Dose Route Frequency Provider Last Rate Last Admin   0.9 %  sodium chloride infusion   Intravenous Continuous Eulogio Ditch E, NP       alteplase (CATHFLO ACTIVASE) injection 2 mg  2 mg Intracatheter Once PRN Colon Flattery, NP       anticoagulant sodium citrate solution 5 mL  5 mL Intracatheter PRN Colon Flattery, NP       [MAR Hold] Chlorhexidine Gluconate Cloth 2 % PADS 6 each  6 each Topical Daily Shelly Coss, MD   6 each at 06/09/22 0926   [MAR Hold] feeding supplement (NEPRO CARB STEADY) liquid 237 mL  237 mL Oral TID BM Lateef, Munsoor, MD   237 mL at 06/09/22 2013   [MAR Hold] finasteride (PROSCAR) tablet 5 mg  5 mg Oral QPM Cox, Amy N, DO   5 mg at 06/09/22 1710   heparin injection 1,000 Units  1,000 Units Intracatheter PRN Colon Flattery, NP       [MAR Hold] heparin injection 5,000 Units  5,000 Units Subcutaneous Q8H Cox, Amy N, DO   5,000 Units at 05/29/2022 0600   lidocaine (PF) (XYLOCAINE) 1 % injection 5 mL  5 mL Intradermal PRN Breeze, Shantelle, NP       lidocaine (PF) (XYLOCAINE) 1 % injection    PRN Algernon Huxley, MD   5 mL at  06/05/2022 1101   lidocaine-prilocaine (EMLA) cream 1 Application  1 Application Topical PRN Colon Flattery, NP       [MAR Hold] melatonin tablet 5 mg  5 mg Oral QHS Mickeal Skinner A, RPH   5 mg at 06/09/22 2026   [facility] Hold] Oral care mouth rinse  15 mL Mouth Rinse PRN Cox, Amy N, DO       [MAR Hold] oxyCODONE (Oxy IR/ROXICODONE) immediate release tablet 5 mg  5 mg Oral Q6H PRN Shelly Coss, MD   5 mg at 06/09/22 2025   pentafluoroprop-tetrafluoroeth (GEBAUERS) aerosol 1 Application  1 Application Topical PRN Colon Flattery, NP       [MAR Hold] polyethylene glycol (MIRALAX / GLYCOLAX) packet 17 g  17 g Oral Daily Adhikari, Amrit, MD   17 g at 06/02/2022 0911   [MAR Hold] rosuvastatin (CRESTOR) tablet 20 mg  20 mg Oral QHS Cox, Amy N, DO   20 mg at 06/09/22 2026   [MAR Hold] senna-docusate (Senokot-S) tablet 1 tablet  1 tablet Oral QHS PRN Cox, Amy N, DO       [MAR Hold] vitamin B-12 (CYANOCOBALAMIN) tablet 100 mcg  100 mcg Oral Daily Cox, Amy N, DO   100 mcg at 05/26/2022 [phone]  Past Medical History:  Diagnosis Date   Arthritis    BPH (benign prostatic hypertrophy)    Elevated PSA    HLD (hyperlipidemia)    HTN (hypertension)     Past Surgical History:  Procedure Laterality Date   HAND SURGERY Right    nodule removed     Social History   Tobacco Use   Smoking status: Never   Smokeless tobacco: Never  Vaping Use   Vaping Use: Never used  Substance Use Topics   Alcohol use: No    Alcohol/week: 0.0 standard drinks of alcohol   Drug use: No    Family History  Problem Relation Age of Onset   Prostate cancer Father        (not sure)   Bone cancer Father    Kidney disease Neg Hx    Kidney cancer Neg Hx    Bladder Cancer Neg Hx     No Known Allergies   REVIEW OF SYSTEMS (Negative unless checked)  Constitutional: '[]'$ Weight loss  '[]'$ Fever  '[]'$ Chills Cardiac: '[]'$ Chest pain   '[]'$ Chest pressure   '[]'$ Palpitations   '[]'$ Shortness of breath when laying flat   '[]'$ Shortness of  breath at rest   '[]'$ Shortness of breath with exertion. Vascular:  '[]'$ Pain in legs with walking   '[]'$ Pain in legs at rest   '[]'$ Pain in legs when laying flat   '[]'$ Claudication   '[]'$ Pain in feet when walking  '[]'$ Pain in feet at rest  '[]'$ Pain in feet when laying flat   '[]'$ History of DVT   '[]'$ Phlebitis   '[]'$ Swelling in legs   '[]'$ Varicose veins   '[]'$ Non-healing ulcers Pulmonary:   '[]'$ Uses home oxygen   '[]'$ Productive cough   '[]'$ Hemoptysis   '[]'$ Wheeze  '[]'$ COPD   '[]'$ Asthma Neurologic:  '[]'$ Dizziness  '[]'$ Blackouts   '[]'$ Seizures   '[]'$ History of stroke   '[]'$ History of TIA  '[]'$ Aphasia   '[]'$ Temporary blindness   '[]'$ Dysphagia   '[]'$ Weakness or numbness in arms   '[]'$ Weakness or numbness in legs Musculoskeletal:  '[x]'$ Arthritis   '[]'$ Joint swelling   '[]'$ Joint pain   '[]'$ Low back pain Hematologic:  '[]'$ Easy bruising  '[]'$ Easy bleeding   '[]'$ Hypercoagulable state   '[x]'$ Anemic  '[]'$ Hepatitis Gastrointestinal:  '[]'$ Blood in stool   '[]'$ Vomiting blood  '[]'$ Gastroesophageal reflux/heartburn   '[]'$ Difficulty swallowing. Genitourinary:  '[x]'$ Chronic kidney disease   '[]'$ Difficult urination  '[x]'$ Frequent urination  '[]'$ Burning with urination   '[]'$ Blood in urine Skin:  '[]'$ Rashes   '[]'$ Ulcers   '[]'$ Wounds Psychological:  '[]'$ History of anxiety   '[]'$  History of major depression.       Physical Examination  Vitals:   06/14/2022 0343 06/15/2022 0603 05/30/2022 0917 06/13/2022 1042  BP: (!) 102/44 (!) 107/55 (!) 111/49 (!) 109/46  Pulse: (!) 38 (!) 41 (!) 40 (!) 44  Resp: 16 18    Temp: 97.8 F (36.6 C) 97.6 F (36.4 C) 97.7 F (36.5 C) (!) 97.5 F (36.4 C)  TempSrc: Axillary Oral Oral Oral  SpO2: 92% 99% 97% 97%  Weight:      Height:       Body mass index is 22.17 kg/m. Gen: WD/WN Head: [place]/AT, No temporalis wasting Ear/Nose/Throat: Hearing grossly intact, nares w/o erythema or drainage Eyes: Sclera non-icteric, conjunctiva clear Neck: Supple, no nuchal rigidity.  No JVD.  Pulmonary:  Good air movement, clear to auscultation bilaterally.  Cardiac: RRR, no JVD Vascular: Feet are warm  with good capillary refill Gastrointestinal: soft, non-tender/non-distended. No guarding/reflex.  Musculoskeletal: M/S 5/5 throughout.  Extremities without ischemic changes.  No deformity or atrophy. Mild  LE Edema in the lower extremities bilaterally Neurologic: Nonfocal.  Moves all 4 extremities.  Speech is fluent. Psychiatric: Difficult to assess due to the severity of patient's illness. Dermatologic: No rashes or ulcers noted.        CBC Lab Results  Component Value Date   WBC 7.3 06/12/2022   HGB 8.6 (L) 06/09/2022   HCT 24.5 (L) 06/08/2022   MCV 91.8 06/06/2022   PLT 230 06/15/2022    BMET    Component Value Date/Time   NA 125 (L) 05/24/2022 0711   K 4.6 06/03/2022 0711   CL 89 (L) 06/20/2022 0711   CO2 21 (L) 05/27/2022 0711   GLUCOSE 112 (H) 06/16/2022 0711   BUN 101 (H) 06/13/2022 0711   CREATININE 6.08 (H) 06/12/2022 0711   CALCIUM 8.9 05/29/2022 0711   GFRNONAA 8 (L) 05/30/2022 0711   Estimated Creatinine Clearance: 7.7 mL/min (A) (by C-G formula based on SCr of 6.08 mg/dL (H)).  COAG No results found for: "INR", "PROTIME"  Radiology PERIPHERAL VASCULAR CATHETERIZATION  Result Date: 06/22/2022 See surgical note for result.  ECHOCARDIOGRAM COMPLETE  Result Date: 06/06/2022    ECHOCARDIOGRAM REPORT   Patient Name:   Aaron Grimes Date of Exam: 06/06/2022 Medical Rec #:  [id number]             Height:       68.0 in Accession #:    [id number]            Weight:       145.8 lb Date of Birth:  [birth year]            BSA:          1.787 m Patient Age:    5 years              BP:           111/48 mmHg Patient Gender: M                     HR:           40 bpm. Exam Location:  [facility] Procedure: 2D Echo, Cardiac Doppler and Color Doppler Indications:     CHF-acute diastolic Q22.97  History:         Patient has no prior history of Echocardiogram examinations.                  Risk Factors:Dyslipidemia and Hypertension.  Sonographer:     Sherrie Sport Referring Phys:   Minna Merritts Diagnosing Phys: Ida Rogue MD  Sonographer Comments: Suboptimal apical window. IMPRESSIONS  1. Left ventricular ejection fraction, by estimation, is 50 to 55%. The left ventricle has low normal function. The left ventricle has no regional wall motion abnormalities. There is moderate left ventricular hypertrophy. Left ventricular diastolic parameters are indeterminate.  2. Right ventricular systolic function is mildly reduced. The right ventricular size is moderately enlarged. There is normal pulmonary artery systolic pressure. The estimated right ventricular systolic pressure is 98.9 mmHg.  3. Left atrial size was mildly dilated.  4. Moderate pleural effusion in the left lateral region.  5. The mitral valve is normal in structure. Moderate mitral valve regurgitation. No evidence of mitral stenosis.  6. Tricuspid valve regurgitation is moderate.  7. The aortic valve is tricuspid. Aortic valve regurgitation is not visualized. Aortic valve sclerosis/calcification is present, without any evidence of aortic stenosis. Aortic valve mean gradient measures 3.0 mmHg.  8. The inferior vena cava is  normal in size with greater than 50% respiratory variability, suggesting right atrial pressure of 3 mmHg.  9. Bradycardia, rate 44 bpm 10. Right atrial size was mildly dilated. FINDINGS  Left Ventricle: Left ventricular ejection fraction, by estimation, is 50 to 55%. The left ventricle has low normal function. The left ventricle has no regional wall motion abnormalities. The left ventricular internal cavity size was normal in size. There is moderate left ventricular hypertrophy. Left ventricular diastolic parameters are indeterminate. Right Ventricle: The right ventricular size is moderately enlarged. No increase in right ventricular wall thickness. Right ventricular systolic function is mildly reduced. There is normal pulmonary artery systolic pressure. The tricuspid regurgitant velocity is 2.23 m/s, and with  an assumed right atrial pressure of 5 mmHg, the estimated right ventricular systolic pressure is 26.9 mmHg. Left Atrium: Left atrial size was mildly dilated. Right Atrium: Right atrial size was mildly dilated. Pericardium: There is no evidence of pericardial effusion. Mitral Valve: The mitral valve is normal in structure. Moderate mitral valve regurgitation. No evidence of mitral valve stenosis. Tricuspid Valve: The tricuspid valve is normal in structure. Tricuspid valve regurgitation is moderate . No evidence of tricuspid stenosis. Aortic Valve: The aortic valve is tricuspid. Aortic valve regurgitation is not visualized. Aortic valve sclerosis/calcification is present, without any evidence of aortic stenosis. Aortic valve mean gradient measures 3.0 mmHg. Aortic valve peak gradient measures 5.5 mmHg. Aortic valve area, by VTI measures 2.43 cm. Pulmonic Valve: The pulmonic valve was normal in structure. Pulmonic valve regurgitation is mild. No evidence of pulmonic stenosis. Aorta: The aortic root is normal in size and structure. Venous: The inferior vena cava is normal in size with greater than 50% respiratory variability, suggesting right atrial pressure of 3 mmHg. IAS/Shunts: No atrial level shunt detected by color flow Doppler. Additional Comments: There is a moderate pleural effusion in the left lateral region.  LEFT VENTRICLE PLAX 2D LVIDd:         3.10 cm   Diastology LV PW:         1.00 cm   LV e' medial: 8.38 cm/s LV IVS:        1.40 cm LVOT diam:     1.90 cm LV SV:         54 LV SV Index:   30 LVOT Area:     2.84 cm  LEFT ATRIUM           Index        RIGHT ATRIUM           Index LA diam:      4.40 cm 2.46 cm/m   RA Area:     20.00 cm LA Vol (A2C): 37.0 ml 20.71 ml/m  RA Volume:   63.40 ml  35.48 ml/m LA Vol (A4C): 50.4 ml 28.21 ml/m  AORTIC VALVE AV Area (Vmax):    1.85 cm AV Area (Vmean):   1.64 cm AV Area (VTI):     2.43 cm AV Vmax:           117.00 cm/s AV Vmean:          83.400 cm/s AV VTI:             0.224 m AV Peak Grad:      5.5 mmHg AV Mean Grad:      3.0 mmHg LVOT Vmax:         76.50 cm/s LVOT Vmean:        48.200 cm/s LVOT VTI:  0.192 m LVOT/AV VTI ratio: 0.86  AORTA Ao Root diam: 3.20 cm MITRAL VALVE             TRICUSPID VALVE MV Area (PHT): 3.46 cm  TR Peak grad:   19.9 mmHg MV Decel Time: 219 msec  TR Vmax:        223.00 cm/s                           SHUNTS                          Systemic VTI:  0.19 m                          Systemic Diam: 1.90 cm Ida Rogue MD Electronically signed by Ida Rogue MD Signature Date/Time: 06/06/2022/2:07:20 PM    Final    US Venous Img Lower Bilateral  Result Date: 06/09/2022 CLINICAL DATA:  Bilateral lower extremity edema. EXAM: BILATERAL LOWER EXTREMITY VENOUS DOPPLER ULTRASOUND TECHNIQUE: Gray-scale sonography with graded compression, as well as color Doppler and duplex ultrasound were performed to evaluate the lower extremity deep venous systems from the level of the common femoral vein and including the common femoral, femoral, profunda femoral, popliteal and calf veins including the posterior tibial, peroneal and gastrocnemius veins when visible. The superficial great saphenous vein was also interrogated. Spectral Doppler was utilized to evaluate flow at rest and with distal augmentation maneuvers in the common femoral, femoral and popliteal veins. COMPARISON:  None Available. FINDINGS: RIGHT LOWER EXTREMITY Common Femoral Vein: No evidence of thrombus. Normal compressibility, respiratory phasicity and response to augmentation. Saphenofemoral Junction: No evidence of thrombus. Normal compressibility and flow on color Doppler imaging. Profunda Femoral Vein: No evidence of thrombus. Normal compressibility and flow on color Doppler imaging. Femoral Vein: No evidence of thrombus. Normal compressibility, respiratory phasicity and response to augmentation. Popliteal Vein: No evidence of thrombus. Normal compressibility, respiratory  phasicity and response to augmentation. Calf Veins: No evidence of thrombus. Normal compressibility and flow on color Doppler imaging. Superficial Great Saphenous Vein: No evidence of thrombus. Normal compressibility. Venous Reflux:  None. Other Findings: No evidence of superficial thrombophlebitis or abnormal fluid collection. LEFT LOWER EXTREMITY Common Femoral Vein: No evidence of thrombus. Normal compressibility, respiratory phasicity and response to augmentation. Saphenofemoral Junction: No evidence of thrombus. Normal compressibility and flow on color Doppler imaging. Profunda Femoral Vein: No evidence of thrombus. Normal compressibility and flow on color Doppler imaging. Femoral Vein: No evidence of thrombus. Normal compressibility, respiratory phasicity and response to augmentation. Popliteal Vein: No evidence of thrombus. Normal compressibility, respiratory phasicity and response to augmentation. Calf Veins: No evidence of thrombus. Normal compressibility and flow on color Doppler imaging. Superficial Great Saphenous Vein: No evidence of thrombus. Normal compressibility. Venous Reflux:  None. Other Findings: No evidence of superficial thrombophlebitis. Popliteal fossa cyst on the left measures roughly 4 x 1.4 x 3 cm and is consistent with a Baker's cyst. IMPRESSION: 1. No evidence of deep vein thrombosis in either lower extremity. 2. Baker's cyst of left popliteal fossa measuring up to 4 cm in maximum diameter. Electronically Signed   By: Aletta Edouard M.D.   On: 06/14/2022 13:52   CT Renal Stone Study  Result Date: 06/21/2022 CLINICAL DATA:  Bladder neck obstruction by report. * Tracking Code: BO * EXAM: CT ABDOMEN AND PELVIS WITHOUT CONTRAST TECHNIQUE: Multidetector CT imaging of the  abdomen and pelvis was performed following the standard protocol without IV contrast. RADIATION DOSE REDUCTION: This exam was performed according to the departmental dose-optimization program which includes automated  exposure control, adjustment of the mA and/or kV according to patient size and/or use of iterative reconstruction technique. COMPARISON:  April 23, 2022 FINDINGS: Lower chest: Small bilateral pleural effusions. Basilar atelectasis. No dense consolidation. Heart size is enlarged with extensive coronary artery calcification. Low-attenuation cardiac blood pools is likely indicative of anemia. Hepatobiliary: Smooth hepatic contours. No pericholecystic stranding. No gross biliary duct distension. No visible lesion on noncontrast imaging. Cholelithiasis. Pancreas: Pancreas normal contours, no signs of inflammation or peripancreatic fluid. Spleen: Normal. Adrenals/Urinary Tract: Adrenal glands are normal. Perinephric stranding bilaterally without perinephric fluid. Generalized increase in body wall edema signs of mild anasarca makes this finding nonspecific. There is no hydronephrosis. Adrenal glands are normal. Urinary bladder partially collapsed with Foley catheter in-situ. No focal perinephric stranding. Stomach/Bowel: Stomach without signs of acute process. Mildly distended fluid-filled distal small bowel loops. No signs of bowel obstruction. Normal appendix. No pericolonic stranding. Vascular/Lymphatic: Calcified aortic atherosclerosis without aneurysm. Small lymph nodes in the retroperitoneum and pelvis better delineated on recent CT. Reproductive: Prostate not well assessed on CT. Prostate is not enlarged substantially Other: Small volume free fluid in the pelvis is nonspecific and perhaps minimally increased from previous imaging. No pneumoperitoneum. Musculoskeletal: No acute bone finding. Lucent area in RIGHT posterior eighth rib at the costovertebral angle with mild expansion of the rib is of uncertain significance. Mild increased radio tracer accumulation in this location is noted on previous imaging. Spinal degenerative changes. Body wall edema slightly increased compared to previous imaging. IMPRESSION: 1.  Bilateral perinephric stranding without hydronephrosis. Findings could reflect sequela of renal dysfunction or be related to slight increase in generalized findings of mild anasarca. Would also correlate with urinalysis to exclude urinary tract infection. 2. Small bilateral pleural effusions with bibasilar atelectasis. 3. Query benign findings of process such as fibrous dysplasia versus is monostotic metastatic process in the RIGHT posterior eighth rib. Correlation with PSA and attention on follow-up is suggested. 4. Small retroperitoneal lymph nodes and pelvic lymph nodes better seen on contrasted CT, please refer to previous imaging for oncologic details. 5. Cholelithiasis. 6. Aortic atherosclerosis. Aortic Atherosclerosis (ICD10-I70.0). Electronically Signed   By: Zetta Bills M.D.   On: 06/18/2022 11:55   DG Chest Portable 1 View  Result Date: 06/15/2022 CLINICAL DATA:  Bilateral leg swelling EXAM: PORTABLE CHEST 1 VIEW COMPARISON:  None Available. FINDINGS: The cardiomediastinal silhouette is within normal limits in size. There is no focal airspace consolidation. There is no pleural effusion. No pneumothorax. There is no acute osseous abnormality. Bilateral glenohumeral osteoarthritis with ossified joint bodies on the right. Thoracic spondylosis.  Defibrillator pads overlie the chest. IMPRESSION: No evidence of acute cardiopulmonary disease. Electronically Signed   By: Maurine Simmering M.D.   On: 06/21/2022 10:18      Assessment/Plan 1. Acute Kidney Injury We will proceed with temporary dialysis catheter placement at this time.  Risks and benefits discussed with patient and/or family, and the catheter will be placed to allow immediate initiation of dialysis.  If the patient's renal function does not improve throughout the hospital course, we will be happy to place a tunneled dialysis catheter for long term use prior to discharge.   2.  Hypertension.  Likely an underlying cause of renal issues at a  baseline and blood pressure control important in reducing the progression of atherosclerotic disease. On  appropriate oral medications. 3.  Hyperlipidemia. lipid control important in reducing the progression of atherosclerotic disease. Continue statin therapy    Leotis Pain, MD  05/29/2022 11:21 AM

## 2022-06-10 NOTE — Progress Notes (Signed)
OT Cancellation Note  Patient Details Name: Aaron Grimes MRN: [id number] DOB: Jan 17, 1930   Cancelled Treatment:    Reason Eval/Treat Not Completed: Patient at procedure or test/ unavailable. OT will re-attempt when pt is next available.   Darleen Crocker, [place], OTR/L , CBIS ascom [phone]  05/27/2022, 2:53 PM

## 2022-06-10 NOTE — Progress Notes (Signed)
   06/03/2022 0219  Assess: MEWS Score  BP (!) 90/51  MAP (mmHg) (!) 63  Pulse Rate (!) 44  Assess: MEWS Score  MEWS Temp 0  MEWS Systolic 1  MEWS Pulse 1  MEWS RR 0  MEWS LOC 0  MEWS Score 2  MEWS Score Color Yellow  Assess: if the MEWS score is Yellow or Red  Were vital signs taken at a resting state? Yes  Focused Assessment No change from prior assessment  Does the patient meet 2 or more of the SIRS criteria? No  MEWS guidelines implemented *See Row Information* Yes  Treat  MEWS Interventions Administered scheduled meds/treatments;Escalated (See documentation below)  Pain Scale 0-10  Take Vital Signs  Increase Vital Sign Frequency  Yellow: Q 2hr X 2 then Q 4hr X 2, if remains yellow, continue Q 4hrs  Escalate  MEWS: Escalate Yellow: discuss with charge nurse/RN and consider discussing with provider and RRT  Notify: Charge Nurse/RN  Name of Charge Nurse/RN Notified [place]  Date Charge Nurse/RN Notified 06/02/2022  Time Charge Nurse/RN Notified 0126  Document  Patient Outcome Stabilized after interventions  Assess: SIRS CRITERIA  SIRS Temperature  0  SIRS Pulse 0  SIRS Respirations  0  SIRS WBC 1  SIRS Score Sum  1   Downtrending BP in setting of ongoing bradycardia/junctional rhythm. Notified Dr. Damita Dunnings who ordered IV fluid bolus. Pt received and tolerated well. MAP improved slightly, received order for repeat bolus, continuing Yellow MEWS precautions.

## 2022-06-10 NOTE — Consult Note (Signed)
Consultation Note Date: 06/03/2022   Patient Name: Aaron Grimes  DOB: 1929/11/05  MRN: [id number]  Age / Sex: 88 y.o., male  PCP: Lenard Simmer, MD Referring Physician: Tawni Millers  Reason for Consultation: Establishing goals of care  HPI/Patient Profile: 88 y.o. male  with past medical history of neuropathy, HTN, HLD, BPH, and prostate cancer admitted on 06/10/2022 with shortness of breath, lower extremity edema, and decreased urine output for the last 2 weeks.  Patient was referred by PCP for evaluation of abnormal labs.  On presentation to ED, patient's sodium was 126, potassium 3, troponin 239, creatinine 4.65, and BNP 2,819.  CT revealed bilateral perinephric stranding without hydronephrosis.  Under suspicion of urinary retention, Foley was placed.  Nephrology consulted for severe worsening of AKI.  HD catheter placed and patient to receive HD today.  Today marks day 6 of hospitalization.  PMT was consulted to discuss goals of care.  Clinical Assessment and Goals of Care: I have reviewed medical records including EPIC notes, labs and imaging, assessed the patient and then met with patient and his daughter at bedside to discuss diagnosis prognosis, GOC, EOL wishes, disposition and options.  I introduced Palliative Medicine as specialized medical care for people living with serious illness. It focuses on providing relief from the symptoms and stress of a serious illness. The goal is to improve quality of life for both the patient and the family.  We discussed a brief life review of the patient.  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  After retirement, patient shares he enjoyed fishing and mowing the lawn.  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  As far as functional and nutritional status  patient denies difficulty PTA.  He endorses shortness of breath and feelings that "something was off" approximately 1 week PTA.  We discussed patient's current illness and what it means in the larger context of patient's on-going co-morbidities.  I attempted to elicit values and goals of care important to the patient.  Patient says he is going to "take it as it comes" and "1 day at a time".  Patient is in agreement to trial HD in hopes of being able to return home to be with his wife.  Patient shares he understands the risk and benefits associated with HD and is hopeful that this is just a "short term thing".  Reviewed that patient will likely feel drained and perhaps tired after first session but that it is hopefully a little pain for gain  Patient offers no issues or concerns regarding HD today.   Advance directives and concepts specific to code status were considered and discussed.  Patient confirmed DNR status with daughter present.  Discussed with patient/family the importance of continued conversation with family and the medical providers regarding overall plan of care and treatment options, ensuring decisions are within the context of the patient's values and GOCs.    Patient has  low symptom burden at this time.  He has no acute complaints.  He endorses chronic arthritic pain in arms and hands.  Questions and concerns were addressed. The family was encouraged to call with questions or concerns.  Patient/daughter given PMT contact info.  PMT will continue to follow the patient and support him and his family throughout this hospitalization.  Primary Decision Maker PATIENT  Code Status/Advance Care Planning: DNR  Prognosis:   Unable to determine  Discharge Planning: To Be Determined  Primary Diagnoses: Present on Admission:  Acute kidney injury superimposed on chronic kidney disease ([place])  BPH with obstruction/lower urinary tract symptoms  Essential hypertension  Heart failure with  preserved ejection fraction ([place])  Dyslipidemia   Physical Exam Constitutional:      General: He is not in acute distress.    Appearance: Normal appearance. He is normal weight. He is not toxic-appearing.  HENT:     Head: Normocephalic.     Mouth/Throat:     Mouth: Mucous membranes are moist.  Eyes:     Pupils: Pupils are equal, round, and reactive to light.  Cardiovascular:     Rate and Rhythm: Normal rate.     Pulses: Normal pulses.  Pulmonary:     Effort: Pulmonary effort is normal.  Abdominal:     Palpations: Abdomen is soft.  Musculoskeletal:     Comments: Bilateral UE decreased ROM d/t arthritis of shoulder and hands  Skin:    General: Skin is warm and dry.  Neurological:     Mental Status: He is alert and oriented to person, place, and time.  Psychiatric:        Mood and Affect: Mood normal.        Behavior: Behavior normal.        Thought Content: Thought content normal.        Judgment: Judgment normal.     Palliative Assessment/Data: 50%     Thank you for this consult. Palliative medicine will continue to follow and assist holistically.   Time Total: 75 minutes Greater than 50%  of this time was spent counseling and coordinating care related to the above assessment and plan.  Signed by: Jordan Hawks, DNP, FNP-BC Palliative Medicine    Please contact Palliative Medicine Team phone at [phone] for questions and concerns.  For individual provider: See Shea Evans

## 2022-06-10 NOTE — Assessment & Plan Note (Signed)
Continue with iron supplementation.

## 2022-06-10 NOTE — Care Management Important Message (Signed)
Important Message  Patient Details  Name: Aaron Grimes MRN: [id number] Date of Birth: 1929/12/04   Medicare Important Message Given:  Yes  Out of room upon time of visit.  Confirmed copy of Medicare IM available in room for reference.   Dannette Barbara 06/14/2022, 12:28 PM

## 2022-06-10 NOTE — Assessment & Plan Note (Addendum)
Echocardiogram with preserved LV systolic function EF 50 to 55%, with moderate LVH. RV with mild reduction in systolic function. Moderate enlarge cavity. Moderate TR and MR.   Elevated troponin with no signs of acute coronary syndrome.   Continue edematous.  Plan to start patient with renal replacement therapy.  Ultrafiltration as tolerated.   Bradycardia with junctional rhythm Avoid B blocker.

## 2022-06-11 DIAGNOSIS — I472 Ventricular tachycardia, unspecified: Secondary | ICD-10-CM

## 2022-06-11 DIAGNOSIS — N189 Chronic kidney disease, unspecified: Secondary | ICD-10-CM | POA: Diagnosis not present

## 2022-06-11 DIAGNOSIS — N179 Acute kidney failure, unspecified: Secondary | ICD-10-CM | POA: Diagnosis not present

## 2022-06-11 DIAGNOSIS — R001 Bradycardia, unspecified: Secondary | ICD-10-CM

## 2022-06-11 DIAGNOSIS — I5033 Acute on chronic diastolic (congestive) heart failure: Secondary | ICD-10-CM | POA: Diagnosis not present

## 2022-06-11 LAB — BASIC METABOLIC PANEL
Anion gap: 12 (ref 5–15)
BUN: 85 mg/dL — ABNORMAL HIGH (ref 8–23)
CO2: 25 mmol/L (ref 22–32)
Calcium: 8.6 mg/dL — ABNORMAL LOW (ref 8.9–10.3)
Chloride: 91 mmol/L — ABNORMAL LOW (ref 98–111)
Creatinine, Ser: 5.23 mg/dL — ABNORMAL HIGH (ref 0.61–1.24)
GFR, Estimated: 10 mL/min — ABNORMAL LOW (ref >60)
Glucose, Bld: 111 mg/dL — ABNORMAL HIGH (ref 70–99)
Potassium: 4 mmol/L (ref 3.5–5.1)
Sodium: 128 mmol/L — ABNORMAL LOW (ref 135–145)

## 2022-06-11 LAB — HEPATITIS B E ANTIBODY: Hep B E Ab: NEGATIVE

## 2022-06-11 LAB — HEPATITIS B SURFACE ANTIBODY, QUANTITATIVE: Hep B S AB Quant (Post): <3.1 m[IU]/mL — ABNORMAL LOW (ref >9.9)

## 2022-06-11 MED ORDER — HEPARIN SODIUM (PORCINE) 1000 UNIT/ML IJ SOLN
INTRAMUSCULAR | Status: AC
  Filled 2022-06-11: qty 10

## 2022-06-11 MED ORDER — AMIODARONE HCL IN DEXTROSE 360-4.14 MG/200ML-% IV SOLN: 30.0000 mg/h | INTRAVENOUS | Status: DC

## 2022-06-11 MED ORDER — ATROPINE SULFATE 1 MG/10ML IJ SOSY
0.5000 mg | PREFILLED_SYRINGE | INTRAMUSCULAR | Status: DC | PRN
  Filled 2022-06-11: qty 5
  Filled 2022-06-11: qty 10

## 2022-06-11 MED ORDER — AMIODARONE HCL IN DEXTROSE 360-4.14 MG/200ML-% IV SOLN
60.0000 mg/h | INTRAVENOUS | Status: DC
  Administered 2022-06-11: 60 mg/h via INTRAVENOUS
  Filled 2022-06-11: qty 200

## 2022-06-12 ENCOUNTER — Ambulatory Visit: Payer: Medicare Other | Admitting: Urology

## 2022-06-23 NOTE — Progress Notes (Signed)
Pt HD treatment started at 0312 w/ no complications. RR called for patient at 0830 d/t sbp 62/43, HR 202 briefly and 02 sats 87%.Pt placed on 10L [place]. Pt not responding to voice. Driscilla Moats MD and Sherlyn Hay. NP both present at the time of patient decline. Pt is a DNR. Patient HD treatment stopped, blood returned and HR decreased to 31.  0833 Pt received 266m NS bolus given, placed on NRB mask at 15L, sats 100%, sbp 50/48 map 86. HR 58. CBG 98. Pt responds to voice, no changes in mentation, patient back to baseline.  0842 2nd RR called on patient. Sbp 155/140 map 146, sbp recheck  43/45, HR 202 briefly, then 41, remains 100% on 15L NRB, pt not responding to voice. SSherlyn Hay NP at bedside with RN. 2070mNS bolus given. [id number] team moved patient back to 260. RR nurse called 2A to confirm. Pt responding to voice, CCMD notified of patient movement, RR RN and HD RN transporting patient. Last vitals recorded on flowsheet. S.Sherlyn HayNP present and aware.   0910 HD team debrief complete.

## 2022-06-23 NOTE — Progress Notes (Signed)
PT Cancellation Note  Patient Details Name: Aaron Grimes MRN: [id number] DOB: 1929/10/18   Cancelled Treatment:    Reason Eval/Treat Not Completed: Patient not medically ready. Pt had a rapid response this AM, pending further medical work up. PT to re-attempt as able.   Lieutenant Diego PT, DPT 10:37 AM,2022-06-16

## 2022-06-23 NOTE — Progress Notes (Signed)
Rounding Note    Patient Name: Aaron Grimes Date of Encounter: Jun 21, 2022  [place] Cardiologist: [facility]  Subjective   Patient underwent first hemodialysis session yesterday without difficulty.  While starting dialysis this morning, he had episodes of ventricular tachycardia followed by profound bradycardia and associated loss of consciousness.  He is currently awake and without complaints but somnolent.  Inpatient Medications    Scheduled Meds:  Chlorhexidine Gluconate Cloth  6 each Topical Daily   feeding supplement (NEPRO CARB STEADY)  237 mL Oral TID BM   finasteride  5 mg Oral QPM   heparin  5,000 Units Subcutaneous Q8H   melatonin  5 mg Oral QHS   polyethylene glycol  17 g Oral Daily   rosuvastatin  20 mg Oral QHS   cyanocobalamin  100 mcg Oral Daily   Continuous Infusions:  amiodarone 60 mg/hr (06-21-22 0940)   amiodarone     anticoagulant sodium citrate     sodium chloride Stopped (06/03/2022 1210)   PRN Meds: alteplase, anticoagulant sodium citrate, atropine, heparin, lidocaine (PF), lidocaine-prilocaine, mouth rinse, oxyCODONE, pentafluoroprop-tetrafluoroeth, senna-docusate   Vital Signs    Vitals:   2022-06-21 0925 June 21, 2022 0934 06-21-22 0935 21-Jun-2022 0949  BP:  (!) 112/56  (!) 135/56  Pulse:  (!) 42  (!) 57  Resp: 16  14   Temp:      TempSrc:      SpO2:   100%   Weight:      Height:        Intake/Output Summary (Last 24 hours) at Jun 21, 2022 1003 Last data filed at 06/21/2022 0535 Gross per 24 hour  Intake 480 ml  Output 300 ml  Net 180 ml      06-21-22    7:53 AM 06/02/2022    2:39 PM 06/14/2022    2:00 PM  Last 3 Weights  Weight (lbs) 150 lb 12.7 oz 155 lb 3.3 oz 145 lb 12.8 oz  Weight (kg) 68.4 kg 70.4 kg 66.134 kg      Telemetry    Junctional rhythm versus atrial fibrillation with slow ventricular response.  Episodes of ventricular tachycardia lasting up to 80 seconds observed since returning from dialysis.  Up to  3-second pauses also seen. - Personally Reviewed  ECG    Atrial fibrillation with competing junctional rhythm versus sinus arrest and IVCD with left bundle branch block features.- Personally Reviewed  Physical Exam   GEN: Somnolent. Neck: No JVD Cardiac: Bradycardia cardiac and somewhat irregular.  No murmurs. Respiratory: Diminished breath sounds anteriorly with poor inspiratory effort. GI: Soft, nontender, non-distended  MS: No edema; No deformity. Neuro: Somnolent but able to move all 4 extremities slowly. Psych: Somnolent with flat affect.  Labs    High Sensitivity Troponin:   Recent Labs  Lab 06/22/2022 0945 06/09/2022 1147  TROPONINIHS 245* 239*     Chemistry Recent Labs  Lab 06/09/22 7510 06/17/2022 0711 Jun 21, 2022 0640  NA 123* 125* 128*  K 4.7 4.6 4.0  CL 88* 89* 91*  CO2 22 21* 25  GLUCOSE 102* 112* 111*  BUN 97* 101* 85*  CREATININE 5.67* 6.08* 5.23*  CALCIUM 9.1 8.9 8.6*  MG  --  2.5*  --   GFRNONAA 9* 8* 10*  ANIONGAP '13 15 12    '$ Lipids No results for input(s): "CHOL", "TRIG", "HDL", "LABVLDL", "LDLCALC", "CHOLHDL" in the last 168 hours.  Hematology Recent Labs  Lab 06/07/22 0505 06/09/22 0638 06/16/2022 0711  WBC 6.4 11.4* 7.3  RBC 2.63*  2.98* 2.67*  HGB 8.5* 9.4* 8.6*  HCT 24.1* 27.4* 24.5*  MCV 91.6 91.9 91.8  MCH 32.3 31.5 32.2  MCHC 35.3 34.3 35.1  RDW 16.3* 16.5* 16.8*  PLT 203 255 230    Radiology    PERIPHERAL VASCULAR CATHETERIZATION  Result Date: 06/15/2022 See surgical note for result.   Cardiac Studies   TTE (06/06/2022):  1. Left ventricular ejection fraction, by estimation, is 50 to 55%. The  left ventricle has low normal function. The left ventricle has no regional  wall motion abnormalities. There is moderate left ventricular hypertrophy.  Left ventricular diastolic  parameters are indeterminate.   2. Right ventricular systolic function is mildly reduced. The right  ventricular size is moderately enlarged. There is normal  pulmonary artery  systolic pressure. The estimated right ventricular systolic pressure is  07.3 mmHg.   3. Left atrial size was mildly dilated.   4. Moderate pleural effusion in the left lateral region.   5. The mitral valve is normal in structure. Moderate mitral valve  regurgitation. No evidence of mitral stenosis.   6. Tricuspid valve regurgitation is moderate.   7. The aortic valve is tricuspid. Aortic valve regurgitation is not  visualized. Aortic valve sclerosis/calcification is present, without any  evidence of aortic stenosis. Aortic valve mean gradient measures 3.0 mmHg.   8. The inferior vena cava is normal in size with greater than 50%  respiratory variability, suggesting right atrial pressure of 3 mmHg.   9. Bradycardia, rate 44 bpm  10. Right atrial size was mildly dilated.   Patient Profile     88 y.o. male with history of asymptomatic bradycardia, hypertension, hyperlipidemia, BPH, prostate cancer, and neuropathy, admitted with acute renal failure requiring initiation of hemodialysis complicated by sustained ventricular tachycardia and marked symptomatic bradycardia.  Assessment & Plan    Ventricular tachycardia and symptomatic bradycardia: Patient is long history of bradycardia with what appears to be primarily a junctional rhythm though atrial fibrillation with slow ventricular response cannot be entirely excluded.  Beginning this morning, he has had several episodes of sustained ventricular tachycardia followed by profound bradycardia with loss of consciousness.  I worry that he may have underlying ischemia contributing to this as his electrolytes do not appear to be significantly deranged other than modest hyponatremia. -Risks and benefits of antiarrhythmic therapy were discussed with patient's daughter and the clinical team.  We will initiate amiodarone infusion to help reduce recurrent ventricular tachycardia, though we will need to monitor heart rate closely. -Cardiac  catheterization and permanent pacemaker placement discussed with the patient's daughter.  However, given his advanced age and comorbidities, we have agreed to not to pursue these measures. -Atrial fibrillation with slow ventricular response cannot be entirely excluded, though I suspect he likely has junctional rhythm with intermittent sinus beats.  Favor avoidance of anticoagulation given other comorbidities. -Expedite palliative care consultation.  HFpEF: Patient appears grossly euvolemic. -Defer to nephrology regarding volume management in the setting of dialysis.  For questions or updates, please contact [place] Please consult [email or web address] for contact info under [facility].  60 minutes were spent on this encounter, including assessment of patient's arrhythmias, speaking with the patient and his daughter at the bedside, and coordinating care with other services.    Signed, Nelva Bush, MD  07-08-22, 10:03 AM

## 2022-06-23 NOTE — Progress Notes (Signed)
Responded to RR. Upon arrival patient being cared for by staff. Will attempt to follow up.

## 2022-06-23 NOTE — Progress Notes (Signed)
OT Cancellation Note  Patient Details Name: Aaron Grimes MRN: [id number] DOB: [birth year]   Cancelled Treatment:    Reason Eval/Treat Not Completed: Patient at procedure or test/ unavailable. Pt currently off unit at dialysis. OT to re-attempt when pt is next available.   Darleen Crocker, [place], OTR/L , CBIS ascom [phone]  June 20, 2022, 8:23 AM

## 2022-06-23 NOTE — Progress Notes (Signed)
Pre HD RN assesment

## 2022-06-23 NOTE — Progress Notes (Addendum)
[facility], [place] 07/11/22  Subjective:   Hospital day # 7  Patient seen and evaluated during dialysis   HEMODIALYSIS FLOWSHEET:  Blood Flow Rate (mL/min): 250 mL/min Arterial Pressure (mmHg): -60 mmHg Venous Pressure (mmHg): 130 mmHg TMP (mmHg): 13 mmHg Ultrafiltration Rate (mL/min): 280 mL/min Dialysate Flow Rate (mL/min): 300 ml/min Dialysis Fluid Bolus: Normal Saline Bolus Amount (mL): 200 mL  Dialysis recently initiated, tolerating well  Renal: 09/18 0701 - 09/19 0700 In: 480 [P.O.:480] Out: 300 [Urine:300] Lab Results  Component Value Date   CREATININE 5.23 (H) 11-Jul-2022   CREATININE 6.08 (H) 06/17/2022   CREATININE 5.67 (H) 06/09/2022     Objective:  Vital signs in last 24 hours:  Temp:  [97.4 F (36.3 C)-98.2 F (36.8 C)] 98.2 F (36.8 C) (09/19 0753) Pulse Rate:  [31-202] 57 (09/19 0949) Resp:  [0-18] 0 (09/19 1019) BP: (62-135)/(35-60) 135/56 (09/19 0949) SpO2:  [87 %-100 %] 100 % (09/19 0935) Weight:  [68.4 kg-70.4 kg] 68.4 kg (09/19 0753)  Weight change:  Filed Weights   05/28/2022 1400 06/08/2022 1439 July 11, 2022 0753  Weight: 66.1 kg 70.4 kg 68.4 kg    Intake/Output:    Intake/Output Summary (Last 24 hours) at 2022-07-11 1039 Last data filed at 07-11-2022 0535 Gross per 24 hour  Intake 240 ml  Output 300 ml  Net -60 ml      Physical Exam: General: NAD  HEENT Anicteric, moist oral mucous membranes  Pulm/lungs Normal breathing effort, clear to auscultation  CVS/Heart Regular, no rub  Abdomen:  Soft, nontender, nondistended  Extremities: Right greater than left 2+ pitting edema  Neurologic: Alert, oriented  Skin: No acute rashes  GU Foley in place  Access Rt femoral temp cath    Basic Metabolic Panel:  Recent Labs  Lab 06/07/22 0505 06/08/22 0728 06/09/22 0638 06/15/2022 0711 07/11/2022 0640  NA 124* 123* 123* 125* 128*  K 4.1 4.5 4.7 4.6 4.0  CL 89* 88* 88* 89* 91*  CO2 22 21* 22 21* 25  GLUCOSE  98 100* 102* 112* 111*  BUN 91* 94* 97* 101* 85*  CREATININE 5.18* 5.51* 5.67* 6.08* 5.23*  CALCIUM 8.7* 8.8* 9.1 8.9 8.6*  MG  --   --   --  2.5*  --       CBC: Recent Labs  Lab 06/05/22 0625 06/06/22 0536 06/07/22 0505 06/09/22 0638 05/24/2022 0711  WBC 5.5 7.0 6.4 11.4* 7.3  HGB 7.8* 8.3* 8.5* 9.4* 8.6*  HCT 22.1* 23.8* 24.1* 27.4* 24.5*  MCV 90.6 90.2 91.6 91.9 91.8  PLT 161 176 203 255 230       Lab Results  Component Value Date   HEPBSAG NON REACTIVE 06/09/2022      Microbiology:  Recent Results (from the past 240 hour(s))  Urine Culture     Status: None   Collection Time: 06/12/2022 12:14 PM   Specimen: Urine, Clean Catch  Result Value Ref Range Status   Specimen Description   Final    URINE, CLEAN CATCH Performed at [facility], [address], [place], [place] [address]    Special Requests   Final    NONE Performed at [facility], [address] [place], [place] [address]    Culture   Final    NO GROWTH Performed at [facility], [place] [address], [place], [place] [address]    Report Status 06/05/2022 FINAL  Final    Coagulation Studies: No results for input(s): "LABPROT", "INR"  in the last 72 hours.  Urinalysis: No results for input(s): "COLORURINE", "LABSPEC", "PHURINE", "GLUCOSEU", "HGBUR", "BILIRUBINUR", "KETONESUR", "PROTEINUR", "UROBILINOGEN", "NITRITE", "LEUKOCYTESUR" in the last 72 hours.  Invalid input(s): "APPERANCEUR"     Imaging: PERIPHERAL VASCULAR CATHETERIZATION  Result Date: 05/27/2022 See surgical note for result.    Medications:    amiodarone 60 mg/hr (17-Jun-2022 0940)   amiodarone     anticoagulant sodium citrate     sodium chloride Stopped (06/08/2022 1210)    Chlorhexidine Gluconate Cloth  6 each Topical Daily   feeding supplement (NEPRO CARB STEADY)  237 mL Oral TID BM   finasteride  5 mg Oral QPM   heparin  5,000 Units Subcutaneous Q8H   melatonin  5 mg Oral QHS   polyethylene glycol   17 g Oral Daily   rosuvastatin  20 mg Oral QHS   cyanocobalamin  100 mcg Oral Daily   alteplase, anticoagulant sodium citrate, atropine, heparin, lidocaine (PF), lidocaine-prilocaine, mouth rinse, oxyCODONE, pentafluoroprop-tetrafluoroeth, senna-docusate  Assessment/ Plan:  88 y.o. male with hypertension, BPH, hyperlipidemia, suspected prostate cancer   admitted on 05/28/2022 for AKI (acute kidney injury) ([place]) [N17.9]  #Acute kidney injury Unclear cause but likely secondary to aggressive diuresis.  Patient also had reported short-term nonsteroidal use.  Baseline creatinine of 1.5 from April 23, 2022.  No IV contrast exposure.  First dialysis treatment received on 05/28/2022. Tolerated well. Receiving second treatment today, no UF. Will plan for third treatment tomorrow.  ** During second treatment, patient became unresponsive with increased heart rate to 195 and agonal respirations. Staff ended treatment with rinse back and applied oxygen.RRT called.  Patient became responsive Repeat episode a few after recovery, heart rate greater than 200, eyed rolled back, unresponsive, and agonal breathing. RRT called again and patient transported back to room. Awaiting family to arrive at bedside to discuss goals, patient currently DNR. Will not pursue further dialysis.    #Hyponatremia Secondary to volume overload.  Sodium 128. Fluid restriction 1200 mL   #Chronic diastolic CHF, Lower extremity edema As per cardiology note, patient has grade 3 diastolic dysfunction. Cardiology following and feels recent events may be related to cardiac disease. Ordered Amiodarone drip with close monitoring of heart rate.    LOS: [place] 06-17-2309:39 [place], [place]

## 2022-06-23 NOTE — Progress Notes (Signed)
Pt HD treatment not complete. Pt received 13 minutes of HD when RR was called. See progress note. Start: [phone] End: 0827 No fluid removed 3.2L BVP UTA post weight RR transported patient back to room.

## 2022-06-23 NOTE — Significant Event (Signed)
Rapid Response Event Note   Reason for Call :  Unresponsive episode with unstable vital signs in dialysis  Initial Focused Assessment:  Rapid response RN arrived in dialysis Bay 4 with patient surrounded by dialysis staff, nephrology NP, and nephrology MD. Per report from dialysis staff, patient had a sudden spike in heart rate during dialysis. Staff checked patient and he was hard to arouse. BP was low with SBP in 60s. Then heart rate dropped to 20s and patient was unarousable. Staff stopped treatment, rinsed back patient's blood, started bolus, and placed patient on oxygen. By the time rapid response time arrived, patient was arousable to voice, answering questions, vital signs were stable. Patient had known junctional bradycardia per dialysis staff with heart rate baseline in 40s-50s with occasional dips into 20s but with no accompanied change in level of consciousness.  Interventions:  Left rapid response at 08:39 with the plan being for dialysis staff to check patient's blood sugar and transfer patient back to 2A. CBG in the 90s. Dr. Holley Raring was planning to reach out to patient's family to discuss next steps in patient's plan of care. However, at 08:45, repaged by dialysis staff. Patient had another unresponsive episode with bradycardia in the 20s. By the time this RN arrived, patient was arousable and able to hold a conversation, vital signs stable again. Only complaint was that he was sleepy. Spoke with patient's nurse on 2A Mia, charge nurse Amy, and paged Dr. Arbutus Ped (patient's attending).   Patient transported back to 2A with this RN and dialysis RN. Met in patient's room by patient's nurse and charge nurse. Spoke with Dr. Arbutus Ped and updated her via phone of patient's situation. She consulted cardiology and was reviewing patient's chart before coming to bedside to personally assess patient.   While in 2A patient had multiple events where he became unresponsive, had agonal breathing, and lost  a pulse. Dr. Saunders Revel witnessed one event and rhythm appeared to be ventricular (fib or tach). Rhythm did progress to brief asystole, and bradycardia. Each event was between 30 seconds to a minute and patient would have return of regular respirations, heart rate would progress back to his baseline, and he would awaken. He had no memory of these events and continued to only have a complaint of sleepiness.  Vital signs would be within normal limits except for heart rate, immediately following event. Events occurred at 09:08, 09:22, 09:38, and 09:54. Dr. Saunders Revel ordered 12 lead EKG and upon review, amiodarone infusion started per orders.   Plan of Care:  While daughter at bedside talking with Dr. Arbutus Ped and this nurse, patient went unresponsive, began agonal respirations and appeared in vfib which progressed to asystole. Patient's heart rhythm and respirations did not resume. Patient passed away. Pronounced at 10:19.  Event Summary:   MD Notified: Dr. Arbutus Ped and Dr. Saunders Revel Call Time: 1st 08:32 2nd 08:45 Arrival Time: 1st 08:34 2nd 08:47 End Time: 1st 08:39 2nd 10:19  Stephanie Acre, RN

## 2022-06-23 NOTE — Death Summary Note (Addendum)
DEATH SUMMARY   Patient Details  Name: Aaron Grimes MRN: [id number] DOB: 1930/05/07 WHQ:[id number], Lourdes Sledge, MD Admission/Discharge Information   Admit Date:  Jun 29, 2022  Date of Death: Date of Death: 07-06-2022  Time of Death: Time of Death: 71  Length of Stay: 7   Principle Cause of death: Ventricular tachycardia  Hospital Diagnoses: Principal Problem:   Acute kidney injury superimposed on chronic kidney disease ([place]) Active Problems:   BPH with obstruction/lower urinary tract symptoms   Essential hypertension   Heart failure with preserved ejection fraction [facility])   Dyslipidemia   Hospital Course: Mr. Aaron Grimes is an 88 year old male with neuropathy, hypertension, hyperlipidemia, BPH, presents emergency department for chief concerns of abnormal labs via PCP.  Initial vitals in the emergency department showed temperature of 97.5, respiration rate of 13, heart rate of 40, blood pressure 112/49, SPO2 of 98% on room air.  Serum sodium is 126, potassium 3.0, chloride of 88, bicarb 23, BUN of 79, serum creatinine of 4.65, e GFR was 11, nonfasting blood glucose is 124.  WBC 7.6, hemoglobin 8.6, platelets of 185.  BNP was elevated at 2819.7.  High sensitive troponin was 245.  EDP discussed the patient with Dr. Candiss Norse who recommends Foley placement and correct potassium and admission with renal monitoring.  CT renal stone study: Bilateral perinephritic stranding without hydronephrosis.  Findings could reflect renal dysfunction or slight increase in generalized findings of mild anasarca.  Small bilateral pleural effusion with by basilar atelectasis.  Query benign finding process such as fibrous dysplasia versus monostotic metastatic process in the right posterior eighth rib.  Small retroperitoneal lymph nodes and pelvic lymph nodes.  Portable chest x-ray was read as no evidence of acute cardiopulmonary disease.  ED treatment: Potassium chloride 20 mill equivalent p.o.  one-time dose, calcium gluconate 1 g IV, potassium chloride 10 mill equivalent IV x2 doses.  Patient had worsening renal function.   09/18 temporal HD cathter placed for renal replacement therapy.     Jul 07, 2023 -- pt had worsening bradycardia, then developed runs of VT and intermittent V-fib. He was intermittently unresponsive, and when awake, he was fully oriented and complained only of fatigue. He was seen by cardiology this AM, they felt most likely a coronary event was likely culprit.  Given DNR status which was confirmed at bedside with patient and daughter this AM, no aggressive interventions or resuscitative efforts were taken when we lost palpable pulse and patient became unresponsive.   Patient remained pulseless and death was pronounced at 1019. I was at bedside with rapid response RN and patient's daughter at the time.      Assessment and Plan: * Acute kidney injury superimposed on chronic kidney disease ([place]) CKD stage 3b.   Hyponatremia hyperkalemia  Patient with worsening renal function with serum cr at 6.0 with BUN 101, k 4,6 and serum bicarbonate at 21.  Patient had temporary HD cathter placed in right femoral vein.  Started patient on renal replacement therapy but did not tolerate due to worsening hypotension and bradycardia.   Anemia of chronic renal disease.  hgb was stable  BPH with obstruction/lower urinary tract symptoms - Finasteride 5 mg daily resumed for every evening Patient has a foley cathter in place.   Prostate cancer. CT abdomen and pelvis with retroperitoneal nodes, indicative of metastatic disease.  Follow up with urology as outpatient.   Essential hypertension Hypotension.   Patient had hypotension 96/40 post procedure.  Added one time bolus 250 ml of NS Continue  close blood pressure monitoring.   Heart failure with preserved ejection fraction (HCC) Echocardiogram with preserved LV systolic function EF 50 to 55%, with moderate LVH. RV with  mild reduction in systolic function. Moderate enlarge cavity. Moderate TR and MR.   Elevated troponin with no signs of acute coronary syndrome.   Continue edematous.  Plan to start patient with renal replacement therapy.  Ultrafiltration as tolerated.   Bradycardia with junctional rhythm Avoid B blocker.   Dyslipidemia Continue with statin therapy.   Iron deficiency anemia Continue with iron supplementation.   Hypokalemia - Presumed secondary to AKI - Status post replacement slowly with potassium chloride 20 mill equivalents p.o., potassium chloride 10 mill equivalent x2 by IV - Repeat BMP in the a.m.   ATN clinically undetermined.      Procedures: femoral dialysis catheter placement, hemodialysis  Consultations: Cardiology, Nephrology, Palliative Care  The results of significant diagnostics from this hospitalization (including imaging, microbiology, ancillary and laboratory) are listed below for reference.   Significant Diagnostic Studies: PERIPHERAL VASCULAR CATHETERIZATION  Result Date: 06/22/2022 See surgical note for result.  ECHOCARDIOGRAM COMPLETE  Result Date: 06/06/2022    ECHOCARDIOGRAM REPORT   Patient Name:   Aaron Grimes Date of Exam: 06/06/2022 Medical Rec #:  [id number]             Height:       68.0 in Accession #:    [id number]            Weight:       145.8 lb Date of Birth:  1930-06-07            BSA:          1.787 m Patient Age:    35 years              BP:           111/48 mmHg Patient Gender: M                     HR:           40 bpm. Exam Location:  [facility] Procedure: 2D Echo, Cardiac Doppler and Color Doppler Indications:     CHF-acute diastolic W41.32  History:         Patient has no prior history of Echocardiogram examinations.                  Risk Factors:Dyslipidemia and Hypertension.  Sonographer:     Sherrie Sport Referring Phys:  Minna Merritts Diagnosing Phys: Ida Rogue MD  Sonographer Comments: Suboptimal apical window. IMPRESSIONS   1. Left ventricular ejection fraction, by estimation, is 50 to 55%. The left ventricle has low normal function. The left ventricle has no regional wall motion abnormalities. There is moderate left ventricular hypertrophy. Left ventricular diastolic parameters are indeterminate.  2. Right ventricular systolic function is mildly reduced. The right ventricular size is moderately enlarged. There is normal pulmonary artery systolic pressure. The estimated right ventricular systolic pressure is 44.0 mmHg.  3. Left atrial size was mildly dilated.  4. Moderate pleural effusion in the left lateral region.  5. The mitral valve is normal in structure. Moderate mitral valve regurgitation. No evidence of mitral stenosis.  6. Tricuspid valve regurgitation is moderate.  7. The aortic valve is tricuspid. Aortic valve regurgitation is not visualized. Aortic valve sclerosis/calcification is present, without any evidence of aortic stenosis. Aortic valve mean gradient measures 3.0 mmHg.  8. The inferior vena cava is  normal in size with greater than 50% respiratory variability, suggesting right atrial pressure of 3 mmHg.  9. Bradycardia, rate 44 bpm 10. Right atrial size was mildly dilated. FINDINGS  Left Ventricle: Left ventricular ejection fraction, by estimation, is 50 to 55%. The left ventricle has low normal function. The left ventricle has no regional wall motion abnormalities. The left ventricular internal cavity size was normal in size. There is moderate left ventricular hypertrophy. Left ventricular diastolic parameters are indeterminate. Right Ventricle: The right ventricular size is moderately enlarged. No increase in right ventricular wall thickness. Right ventricular systolic function is mildly reduced. There is normal pulmonary artery systolic pressure. The tricuspid regurgitant velocity is 2.23 m/s, and with an assumed right atrial pressure of 5 mmHg, the estimated right ventricular systolic pressure is 01.7 mmHg. Left  Atrium: Left atrial size was mildly dilated. Right Atrium: Right atrial size was mildly dilated. Pericardium: There is no evidence of pericardial effusion. Mitral Valve: The mitral valve is normal in structure. Moderate mitral valve regurgitation. No evidence of mitral valve stenosis. Tricuspid Valve: The tricuspid valve is normal in structure. Tricuspid valve regurgitation is moderate . No evidence of tricuspid stenosis. Aortic Valve: The aortic valve is tricuspid. Aortic valve regurgitation is not visualized. Aortic valve sclerosis/calcification is present, without any evidence of aortic stenosis. Aortic valve mean gradient measures 3.0 mmHg. Aortic valve peak gradient measures 5.5 mmHg. Aortic valve area, by VTI measures 2.43 cm. Pulmonic Valve: The pulmonic valve was normal in structure. Pulmonic valve regurgitation is mild. No evidence of pulmonic stenosis. Aorta: The aortic root is normal in size and structure. Venous: The inferior vena cava is normal in size with greater than 50% respiratory variability, suggesting right atrial pressure of 3 mmHg. IAS/Shunts: No atrial level shunt detected by color flow Doppler. Additional Comments: There is a moderate pleural effusion in the left lateral region.  LEFT VENTRICLE PLAX 2D LVIDd:         3.10 cm   Diastology LV PW:         1.00 cm   LV e' medial: 8.38 cm/s LV IVS:        1.40 cm LVOT diam:     1.90 cm LV SV:         54 LV SV Index:   30 LVOT Area:     2.84 cm  LEFT ATRIUM           Index        RIGHT ATRIUM           Index LA diam:      4.40 cm 2.46 cm/m   RA Area:     20.00 cm LA Vol (A2C): 37.0 ml 20.71 ml/m  RA Volume:   63.40 ml  35.48 ml/m LA Vol (A4C): 50.4 ml 28.21 ml/m  AORTIC VALVE AV Area (Vmax):    1.85 cm AV Area (Vmean):   1.64 cm AV Area (VTI):     2.43 cm AV Vmax:           117.00 cm/s AV Vmean:          83.400 cm/s AV VTI:            0.224 m AV Peak Grad:      5.5 mmHg AV Mean Grad:      3.0 mmHg LVOT Vmax:         76.50 cm/s LVOT  Vmean:        48.200 cm/s LVOT VTI:  0.192 m LVOT/AV VTI ratio: 0.86  AORTA Ao Root diam: 3.20 cm MITRAL VALVE             TRICUSPID VALVE MV Area (PHT): 3.46 cm  TR Peak grad:   19.9 mmHg MV Decel Time: 219 msec  TR Vmax:        223.00 cm/s                           SHUNTS                          Systemic VTI:  0.19 m                          Systemic Diam: 1.90 cm Ida Rogue MD Electronically signed by Ida Rogue MD Signature Date/Time: 06/06/2022/2:07:20 PM    Final    US Venous Img Lower Bilateral  Result Date: 06/03/2022 CLINICAL DATA:  Bilateral lower extremity edema. EXAM: BILATERAL LOWER EXTREMITY VENOUS DOPPLER ULTRASOUND TECHNIQUE: Gray-scale sonography with graded compression, as well as color Doppler and duplex ultrasound were performed to evaluate the lower extremity deep venous systems from the level of the common femoral vein and including the common femoral, femoral, profunda femoral, popliteal and calf veins including the posterior tibial, peroneal and gastrocnemius veins when visible. The superficial great saphenous vein was also interrogated. Spectral Doppler was utilized to evaluate flow at rest and with distal augmentation maneuvers in the common femoral, femoral and popliteal veins. COMPARISON:  None Available. FINDINGS: RIGHT LOWER EXTREMITY Common Femoral Vein: No evidence of thrombus. Normal compressibility, respiratory phasicity and response to augmentation. Saphenofemoral Junction: No evidence of thrombus. Normal compressibility and flow on color Doppler imaging. Profunda Femoral Vein: No evidence of thrombus. Normal compressibility and flow on color Doppler imaging. Femoral Vein: No evidence of thrombus. Normal compressibility, respiratory phasicity and response to augmentation. Popliteal Vein: No evidence of thrombus. Normal compressibility, respiratory phasicity and response to augmentation. Calf Veins: No evidence of thrombus. Normal compressibility and flow on  color Doppler imaging. Superficial Great Saphenous Vein: No evidence of thrombus. Normal compressibility. Venous Reflux:  None. Other Findings: No evidence of superficial thrombophlebitis or abnormal fluid collection. LEFT LOWER EXTREMITY Common Femoral Vein: No evidence of thrombus. Normal compressibility, respiratory phasicity and response to augmentation. Saphenofemoral Junction: No evidence of thrombus. Normal compressibility and flow on color Doppler imaging. Profunda Femoral Vein: No evidence of thrombus. Normal compressibility and flow on color Doppler imaging. Femoral Vein: No evidence of thrombus. Normal compressibility, respiratory phasicity and response to augmentation. Popliteal Vein: No evidence of thrombus. Normal compressibility, respiratory phasicity and response to augmentation. Calf Veins: No evidence of thrombus. Normal compressibility and flow on color Doppler imaging. Superficial Great Saphenous Vein: No evidence of thrombus. Normal compressibility. Venous Reflux:  None. Other Findings: No evidence of superficial thrombophlebitis. Popliteal fossa cyst on the left measures roughly 4 x 1.4 x 3 cm and is consistent with a Baker's cyst. IMPRESSION: 1. No evidence of deep vein thrombosis in either lower extremity. 2. Baker's cyst of left popliteal fossa measuring up to 4 cm in maximum diameter. Electronically Signed   By: Aletta Edouard M.D.   On: 06/18/2022 13:52   CT Renal Stone Study  Result Date: 06/22/2022 CLINICAL DATA:  Bladder neck obstruction by report. * Tracking Code: BO * EXAM: CT ABDOMEN AND PELVIS WITHOUT CONTRAST TECHNIQUE: Multidetector CT imaging of the  abdomen and pelvis was performed following the standard protocol without IV contrast. RADIATION DOSE REDUCTION: This exam was performed according to the departmental dose-optimization program which includes automated exposure control, adjustment of the mA and/or kV according to patient size and/or use of iterative reconstruction  technique. COMPARISON:  April 23, 2022 FINDINGS: Lower chest: Small bilateral pleural effusions. Basilar atelectasis. No dense consolidation. Heart size is enlarged with extensive coronary artery calcification. Low-attenuation cardiac blood pools is likely indicative of anemia. Hepatobiliary: Smooth hepatic contours. No pericholecystic stranding. No gross biliary duct distension. No visible lesion on noncontrast imaging. Cholelithiasis. Pancreas: Pancreas normal contours, no signs of inflammation or peripancreatic fluid. Spleen: Normal. Adrenals/Urinary Tract: Adrenal glands are normal. Perinephric stranding bilaterally without perinephric fluid. Generalized increase in body wall edema signs of mild anasarca makes this finding nonspecific. There is no hydronephrosis. Adrenal glands are normal. Urinary bladder partially collapsed with Foley catheter in-situ. No focal perinephric stranding. Stomach/Bowel: Stomach without signs of acute process. Mildly distended fluid-filled distal small bowel loops. No signs of bowel obstruction. Normal appendix. No pericolonic stranding. Vascular/Lymphatic: Calcified aortic atherosclerosis without aneurysm. Small lymph nodes in the retroperitoneum and pelvis better delineated on recent CT. Reproductive: Prostate not well assessed on CT. Prostate is not enlarged substantially Other: Small volume free fluid in the pelvis is nonspecific and perhaps minimally increased from previous imaging. No pneumoperitoneum. Musculoskeletal: No acute bone finding. Lucent area in RIGHT posterior eighth rib at the costovertebral angle with mild expansion of the rib is of uncertain significance. Mild increased radio tracer accumulation in this location is noted on previous imaging. Spinal degenerative changes. Body wall edema slightly increased compared to previous imaging. IMPRESSION: 1. Bilateral perinephric stranding without hydronephrosis. Findings could reflect sequela of renal dysfunction or be  related to slight increase in generalized findings of mild anasarca. Would also correlate with urinalysis to exclude urinary tract infection. 2. Small bilateral pleural effusions with bibasilar atelectasis. 3. Query benign findings of process such as fibrous dysplasia versus is monostotic metastatic process in the RIGHT posterior eighth rib. Correlation with PSA and attention on follow-up is suggested. 4. Small retroperitoneal lymph nodes and pelvic lymph nodes better seen on contrasted CT, please refer to previous imaging for oncologic details. 5. Cholelithiasis. 6. Aortic atherosclerosis. Aortic Atherosclerosis (ICD10-I70.0). Electronically Signed   By: Zetta Bills M.D.   On: 06/12/2022 11:55   DG Chest Portable 1 View  Result Date: 06/07/2022 CLINICAL DATA:  Bilateral leg swelling EXAM: PORTABLE CHEST 1 VIEW COMPARISON:  None Available. FINDINGS: The cardiomediastinal silhouette is within normal limits in size. There is no focal airspace consolidation. There is no pleural effusion. No pneumothorax. There is no acute osseous abnormality. Bilateral glenohumeral osteoarthritis with ossified joint bodies on the right. Thoracic spondylosis.  Defibrillator pads overlie the chest. IMPRESSION: No evidence of acute cardiopulmonary disease. Electronically Signed   By: Maurine Simmering M.D.   On: 05/25/2022 10:18    Microbiology: Recent Results (from the past 240 hour(s))  Urine Culture     Status: None   Collection Time: 06/02/2022 12:14 PM   Specimen: Urine, Clean Catch  Result Value Ref Range Status   Specimen Description   Final    URINE, CLEAN CATCH Performed at [facility], [address], [place], [place] [address]    Special Requests   Final    NONE Performed at [facility] [address] [place], [place] [id number]    Culture   Final    NO GROWTH Performed at  [facility], [place] [address], [place], [place] [address]    Report Status 06/05/2022 FINAL  Final    Time  spent: 45 minutes  Signed: Ezekiel Slocumb, DO Jul 09, 2022

## 2022-06-23 NOTE — Progress Notes (Signed)
In room with pt and family. Pt has had recurring instances of syncope episodes, becoming unresponsive and without a pulse. MD aware and notified, Amio drip started, and consult to palliative warranted. Will continue to monitor and intervene as necessary.

## 2022-06-23 DEATH — deceased

## 2022-06-26 ENCOUNTER — Ambulatory Visit: Payer: Medicare Other | Admitting: Urology

## 2022-08-14 ENCOUNTER — Other Ambulatory Visit: Payer: Medicare Other

## 2022-08-21 ENCOUNTER — Ambulatory Visit: Payer: Medicare Other | Admitting: Urology
